# Patient Record
Sex: Female | Born: 1960 | Race: White | Hispanic: No | Smoking: Never smoker
Health system: Southern US, Community
[De-identification: ages and names within clinical notes are randomized; demographics above are authoritative.]

## PROBLEM LIST (undated history)

## (undated) DIAGNOSIS — N200 Calculus of kidney: Secondary | ICD-10-CM

## (undated) DIAGNOSIS — E7800 Pure hypercholesterolemia, unspecified: Secondary | ICD-10-CM

## (undated) DIAGNOSIS — Z9889 Other specified postprocedural states: Secondary | ICD-10-CM

## (undated) DIAGNOSIS — Z87442 Personal history of urinary calculi: Secondary | ICD-10-CM

## (undated) DIAGNOSIS — R112 Nausea with vomiting, unspecified: Secondary | ICD-10-CM

## (undated) DIAGNOSIS — A159 Respiratory tuberculosis unspecified: Secondary | ICD-10-CM

## (undated) DIAGNOSIS — T8859XA Other complications of anesthesia, initial encounter: Secondary | ICD-10-CM

## (undated) DIAGNOSIS — R32 Unspecified urinary incontinence: Secondary | ICD-10-CM

## (undated) DIAGNOSIS — T4145XA Adverse effect of unspecified anesthetic, initial encounter: Secondary | ICD-10-CM

## (undated) HISTORY — PX: INTERSTIM IMPLANT PLACEMENT: SHX5130

## (undated) HISTORY — PX: CHOLECYSTECTOMY: SHX55

## (undated) HISTORY — PX: TUBAL LIGATION: SHX77

## (undated) HISTORY — PX: HIP SURGERY: SHX245

## (undated) HISTORY — PX: APPENDECTOMY: SHX54

---

## 2013-02-01 DIAGNOSIS — R159 Full incontinence of feces: Secondary | ICD-10-CM | POA: Insufficient documentation

## 2013-02-01 DIAGNOSIS — Z227 Latent tuberculosis: Secondary | ICD-10-CM | POA: Insufficient documentation

## 2013-02-12 HISTORY — PX: ANAL SPHINCTEROPLASTY: SUR1305

## 2015-02-05 DIAGNOSIS — R159 Full incontinence of feces: Secondary | ICD-10-CM | POA: Insufficient documentation

## 2015-04-14 ENCOUNTER — Inpatient Hospital Stay (HOSPITAL_COMMUNITY)
Admission: EM | Admit: 2015-04-14 | Discharge: 2015-04-16 | DRG: 694 | Disposition: A | Discharge status: Home / Self Care | Payer: Non-veteran care | Attending: Internal Medicine | Admitting: Internal Medicine

## 2015-04-14 ENCOUNTER — Encounter (HOSPITAL_COMMUNITY): Payer: Self-pay | Admitting: Emergency Medicine

## 2015-04-14 ENCOUNTER — Emergency Department (HOSPITAL_COMMUNITY): Payer: Non-veteran care

## 2015-04-14 DIAGNOSIS — N23 Unspecified renal colic: Secondary | ICD-10-CM

## 2015-04-14 DIAGNOSIS — N201 Calculus of ureter: Secondary | ICD-10-CM

## 2015-04-14 DIAGNOSIS — R1032 Left lower quadrant pain: Secondary | ICD-10-CM | POA: Diagnosis present

## 2015-04-14 DIAGNOSIS — D72829 Elevated white blood cell count, unspecified: Secondary | ICD-10-CM | POA: Diagnosis present

## 2015-04-14 DIAGNOSIS — N1330 Unspecified hydronephrosis: Secondary | ICD-10-CM

## 2015-04-14 DIAGNOSIS — N139 Obstructive and reflux uropathy, unspecified: Secondary | ICD-10-CM | POA: Diagnosis present

## 2015-04-14 DIAGNOSIS — R110 Nausea: Secondary | ICD-10-CM

## 2015-04-14 DIAGNOSIS — E872 Acidosis: Secondary | ICD-10-CM | POA: Diagnosis present

## 2015-04-14 DIAGNOSIS — N390 Urinary tract infection, site not specified: Secondary | ICD-10-CM | POA: Diagnosis present

## 2015-04-14 DIAGNOSIS — N132 Hydronephrosis with renal and ureteral calculous obstruction: Principal | ICD-10-CM | POA: Diagnosis present

## 2015-04-14 DIAGNOSIS — D696 Thrombocytopenia, unspecified: Secondary | ICD-10-CM | POA: Diagnosis present

## 2015-04-14 DIAGNOSIS — N3080 Other cystitis without hematuria: Secondary | ICD-10-CM

## 2015-04-14 DIAGNOSIS — R1012 Left upper quadrant pain: Secondary | ICD-10-CM

## 2015-04-14 DIAGNOSIS — R109 Unspecified abdominal pain: Secondary | ICD-10-CM

## 2015-04-14 DIAGNOSIS — E875 Hyperkalemia: Secondary | ICD-10-CM | POA: Diagnosis present

## 2015-04-14 DIAGNOSIS — N179 Acute kidney failure, unspecified: Secondary | ICD-10-CM | POA: Diagnosis present

## 2015-04-14 LAB — I-STAT CHEM 8, ED
BUN: 22 mg/dL — AB (ref 6–20)
BUN: 31 mg/dL — ABNORMAL HIGH (ref 6–20)
CALCIUM ION: 1.23 mmol/L (ref 1.12–1.23)
CHLORIDE: 104 mmol/L (ref 101–111)
CREATININE: 1.1 mg/dL — AB (ref 0.44–1.00)
Calcium, Ion: 1.23 mmol/L (ref 1.12–1.23)
Chloride: 104 mmol/L (ref 101–111)
Creatinine, Ser: 1 mg/dL (ref 0.44–1.00)
GLUCOSE: 150 mg/dL — AB (ref 65–99)
Glucose, Bld: 157 mg/dL — ABNORMAL HIGH (ref 65–99)
HCT: 43 % (ref 36.0–46.0)
HEMATOCRIT: 43 % (ref 36.0–46.0)
HEMOGLOBIN: 14.6 g/dL (ref 12.0–15.0)
Hemoglobin: 14.6 g/dL (ref 12.0–15.0)
POTASSIUM: 5.4 mmol/L — AB (ref 3.5–5.1)
Potassium: 3.8 mmol/L (ref 3.5–5.1)
SODIUM: 140 mmol/L (ref 135–145)
Sodium: 142 mmol/L (ref 135–145)
TCO2: 25 mmol/L (ref 0–100)
TCO2: 29 mmol/L (ref 0–100)

## 2015-04-14 LAB — CBC WITH DIFFERENTIAL/PLATELET
Basophils Absolute: 0 10*3/uL (ref 0.0–0.1)
Basophils Relative: 0 %
EOS ABS: 0 10*3/uL (ref 0.0–0.7)
Eosinophils Relative: 0 %
HCT: 40.6 % (ref 36.0–46.0)
HEMOGLOBIN: 13.7 g/dL (ref 12.0–15.0)
LYMPHS ABS: 0.7 10*3/uL (ref 0.7–4.0)
LYMPHS PCT: 6 %
MCH: 29.4 pg (ref 26.0–34.0)
MCHC: 33.7 g/dL (ref 30.0–36.0)
MCV: 87.1 fL (ref 78.0–100.0)
Monocytes Absolute: 0.8 10*3/uL (ref 0.1–1.0)
Monocytes Relative: 7 %
NEUTROS PCT: 87 %
Neutro Abs: 10.8 10*3/uL — ABNORMAL HIGH (ref 1.7–7.7)
Platelets: 144 10*3/uL — ABNORMAL LOW (ref 150–400)
RBC: 4.66 MIL/uL (ref 3.87–5.11)
RDW: 12.6 % (ref 11.5–15.5)
WBC: 12.3 10*3/uL — AB (ref 4.0–10.5)

## 2015-04-14 LAB — COMPREHENSIVE METABOLIC PANEL
ALT: 15 U/L (ref 14–54)
AST: 24 U/L (ref 15–41)
Albumin: 3.9 g/dL (ref 3.5–5.0)
Alkaline Phosphatase: 112 U/L (ref 38–126)
Anion gap: 8 (ref 5–15)
BUN: 22 mg/dL — AB (ref 6–20)
CHLORIDE: 106 mmol/L (ref 101–111)
CO2: 26 mmol/L (ref 22–32)
CREATININE: 1.23 mg/dL — AB (ref 0.44–1.00)
Calcium: 9.3 mg/dL (ref 8.9–10.3)
GFR calc Af Amer: 57 mL/min — ABNORMAL LOW (ref >60)
GFR calc non Af Amer: 50 mL/min — ABNORMAL LOW (ref >60)
Glucose, Bld: 150 mg/dL — ABNORMAL HIGH (ref 65–99)
Potassium: 4 mmol/L (ref 3.5–5.1)
SODIUM: 140 mmol/L (ref 135–145)
Total Bilirubin: 0.5 mg/dL (ref 0.3–1.2)
Total Protein: 7 g/dL (ref 6.5–8.1)

## 2015-04-14 LAB — URINE MICROSCOPIC-ADD ON

## 2015-04-14 LAB — URINALYSIS, ROUTINE W REFLEX MICROSCOPIC
Bilirubin Urine: NEGATIVE
GLUCOSE, UA: NEGATIVE mg/dL
Ketones, ur: NEGATIVE mg/dL
Leukocytes, UA: NEGATIVE
Nitrite: POSITIVE — AB
PH: 8 (ref 5.0–8.0)
Protein, ur: NEGATIVE mg/dL
Specific Gravity, Urine: 1.015 (ref 1.005–1.030)

## 2015-04-14 LAB — LACTIC ACID, PLASMA: Lactic Acid, Venous: 1.4 mmol/L (ref 0.5–2.0)

## 2015-04-14 LAB — I-STAT CG4 LACTIC ACID, ED: LACTIC ACID, VENOUS: 2.05 mmol/L — AB (ref 0.5–2.0)

## 2015-04-14 MED ORDER — ACETAMINOPHEN 325 MG PO TABS: 650.0000 mg | ORAL_TABLET | Freq: Four times a day (QID) | ORAL | Status: DC | PRN

## 2015-04-14 MED ORDER — DEXTROSE 5 % IV SOLN
1.0000 g | Freq: Once | INTRAVENOUS | Status: AC
  Administered 2015-04-14: 1 g via INTRAVENOUS
  Filled 2015-04-14: qty 10

## 2015-04-14 MED ORDER — TAMSULOSIN HCL 0.4 MG PO CAPS
0.4000 mg | ORAL_CAPSULE | Freq: Every day | ORAL | Status: DC
  Administered 2015-04-16: 0.4 mg via ORAL
  Filled 2015-04-14: qty 1

## 2015-04-14 MED ORDER — ENOXAPARIN SODIUM 40 MG/0.4ML ~~LOC~~ SOLN
40.0000 mg | SUBCUTANEOUS | Status: DC
  Administered 2015-04-14: 40 mg via SUBCUTANEOUS
  Filled 2015-04-14: qty 0.4

## 2015-04-14 MED ORDER — ONDANSETRON HCL 4 MG/2ML IJ SOLN
4.0000 mg | INTRAMUSCULAR | Status: AC | PRN
  Administered 2015-04-14 (×2): 4 mg via INTRAVENOUS
  Filled 2015-04-14 (×2): qty 2

## 2015-04-14 MED ORDER — DEXTROSE 5 % IV SOLN
1.0000 g | INTRAVENOUS | Status: DC
  Filled 2015-04-14 (×3): qty 10

## 2015-04-14 MED ORDER — ACETAMINOPHEN 650 MG RE SUPP: 650.0000 mg | Freq: Four times a day (QID) | RECTAL | Status: DC | PRN

## 2015-04-14 MED ORDER — SENNOSIDES-DOCUSATE SODIUM 8.6-50 MG PO TABS
1.0000 | ORAL_TABLET | Freq: Every day | ORAL | Status: DC
  Administered 2015-04-14 – 2015-04-15 (×2): 1 via ORAL
  Filled 2015-04-14 (×2): qty 1

## 2015-04-14 MED ORDER — FENTANYL CITRATE (PF) 100 MCG/2ML IJ SOLN
50.0000 ug | Freq: Once | INTRAMUSCULAR | Status: AC
  Administered 2015-04-14: 50 ug via INTRAVENOUS
  Filled 2015-04-14: qty 2

## 2015-04-14 MED ORDER — ONDANSETRON HCL 4 MG/2ML IJ SOLN
4.0000 mg | Freq: Four times a day (QID) | INTRAMUSCULAR | Status: DC | PRN
  Administered 2015-04-14: 4 mg via INTRAVENOUS
  Filled 2015-04-14: qty 2

## 2015-04-14 MED ORDER — SODIUM CHLORIDE 0.9 % IV SOLN
INTRAVENOUS | Status: DC
  Administered 2015-04-14: 15:00:00 via INTRAVENOUS

## 2015-04-14 MED ORDER — ONDANSETRON HCL 4 MG PO TABS: 4.0000 mg | ORAL_TABLET | Freq: Four times a day (QID) | ORAL | Status: DC | PRN

## 2015-04-14 MED ORDER — MORPHINE SULFATE (PF) 4 MG/ML IV SOLN
4.0000 mg | INTRAVENOUS | Status: DC | PRN
  Administered 2015-04-14: 4 mg via INTRAVENOUS
  Filled 2015-04-14: qty 1

## 2015-04-14 MED ORDER — KETOROLAC TROMETHAMINE 30 MG/ML IJ SOLN
30.0000 mg | Freq: Four times a day (QID) | INTRAMUSCULAR | Status: DC
  Administered 2015-04-14: 30 mg via INTRAVENOUS
  Filled 2015-04-14: qty 1

## 2015-04-14 MED ORDER — KETOROLAC TROMETHAMINE 15 MG/ML IJ SOLN
15.0000 mg | Freq: Four times a day (QID) | INTRAMUSCULAR | Status: DC | PRN
  Administered 2015-04-14: 15 mg via INTRAVENOUS
  Filled 2015-04-14: qty 1

## 2015-04-14 MED ORDER — HYDROMORPHONE HCL 1 MG/ML IJ SOLN
0.5000 mg | INTRAMUSCULAR | Status: DC | PRN
  Administered 2015-04-14 – 2015-04-15 (×3): 0.5 mg via INTRAVENOUS
  Filled 2015-04-14 (×3): qty 1

## 2015-04-14 MED ORDER — SODIUM CHLORIDE 0.9 % IV SOLN: INTRAVENOUS | Status: DC

## 2015-04-14 NOTE — ED Provider Notes (Signed)
CSN: [id number]     Arrival date & time 04/14/15  1113 History   First MD Initiated Contact with Patient 04/14/15 1233     Chief Complaint  Patient presents with  . Flank Pain    left     HPI  Pt was seen at 1230. Per pt, c/o sudden onset and persistence of constant left sided flank "pain" that began this morning PTA.  Pt describes the pain as "radiating" into the left side of her abd.  Has been associated with nausea.   Denies vaginal bleeding/discharge, no dysuria/hematuria, no abd pain, no diarrhea, no black or blood in stools, no CP/SOB, no fevers, no rash.    History reviewed. No pertinent past medical history.   Past Surgical History  Procedure Laterality Date  . Hip surgery      right  . Appendectomy    . Cholecystectomy      Social History  Substance Use Topics  . Smoking status: Passive Smoke Exposure - Never Smoker  . Smokeless tobacco: None  . Alcohol Use: 0.6 oz/week    1 Glasses of wine per week    Review of Systems ROS: Statement: All systems negative except as marked or noted in the HPI; Constitutional: Negative for fever and chills. ; ; Eyes: Negative for eye pain, redness and discharge. ; ; ENMT: Negative for ear pain, hoarseness, nasal congestion, sinus pressure and sore throat. ; ; Cardiovascular: Negative for chest pain, palpitations, diaphoresis, dyspnea and peripheral edema. ; ; Respiratory: Negative for cough, wheezing and stridor. ; ; Gastrointestinal: +nausea. Negative for vomiting, diarrhea, abdominal pain, blood in stool, hematemesis, jaundice and rectal bleeding. . ; ; Genitourinary: +flank pain. Negative for dysuria and hematuria. ; ; Musculoskeletal: Negative for back pain and neck pain. Negative for swelling and trauma.; ; Skin: Negative for pruritus, rash, abrasions, blisters, bruising and skin lesion.; ; Neuro: Negative for headache, lightheadedness and neck stiffness. Negative for weakness, altered level of consciousness , altered mental status,  extremity weakness, paresthesias, involuntary movement, seizure and syncope.      Allergies  Stadol  Home Medications   Prior to Admission medications   Medication Sig Start Date End Date Taking? Authorizing Provider  SIMVASTATIN PO Take 1 tablet by mouth daily.   Yes Historical Provider, MD   BP 168/105 mmHg  Pulse 84  Temp(Src) 97.8 F (36.6 C) (Oral)  Resp 18  Ht  (1.676 m)  Wt 150 lb (68.04 kg)  BMI 24.22 kg/m2  SpO2 98% Physical Exam  1240: Physical examination:  Nursing notes reviewed; Vital signs and O2 SAT reviewed;  Constitutional: Well developed, Well nourished, Well hydrated, Uncomfortable appearing.; Head:  Normocephalic, atraumatic; Eyes: EOMI, PERRL, No scleral icterus; ENMT: Mouth and pharynx normal, Mucous membranes moist; Neck: Supple, Full range of motion, No lymphadenopathy; Cardiovascular: Regular rate and rhythm, No murmur, rub, or gallop; Respiratory: Breath sounds clear & equal bilaterally, No rales, rhonchi, wheezes.  Speaking full sentences with ease, Normal respiratory effort/excursion; Chest: Nontender, Movement normal; Abdomen: Soft, Nontender, Nondistended, Normal bowel sounds; Genitourinary: +left CVA tenderness; Spine:  No midline CS, TS, LS tenderness. +TTP left lumbar paraspinal muscles.;; Extremities: Pulses normal, Pelvis stable. NT left hip. No deformity. No edema, No calf edema or asymmetry.; Neuro: AA&Ox3, Major CN grossly intact.  Speech clear. No gross focal motor or sensory deficits in extremities.; Skin: Color normal, Warm, Dry.   ED Course  Procedures (including critical care time) Labs Review  Imaging Review  I have personally reviewed  and evaluated these images and lab results as part of my medical decision-making.   EKG Interpretation None      MDM  MDM Reviewed: nursing note and vitals Interpretation: labs and CT scan      Results for orders placed or performed during the hospital encounter of 04/14/15  Urinalysis,  Routine w reflex microscopic  Result Value Ref Range   Color, Urine YELLOW YELLOW   APPearance HAZY (A) CLEAR   Specific Gravity, Urine 1.015 1.005 - 1.030   pH 8.0 5.0 - 8.0   Glucose, UA NEGATIVE NEGATIVE mg/dL   Hgb urine dipstick TRACE (A) NEGATIVE   Bilirubin Urine NEGATIVE NEGATIVE   Ketones, ur NEGATIVE NEGATIVE mg/dL   Protein, ur NEGATIVE NEGATIVE mg/dL   Nitrite POSITIVE (A) NEGATIVE   Leukocytes, UA NEGATIVE NEGATIVE  Urine microscopic-add on  Result Value Ref Range   Squamous Epithelial / LPF 0-5 (A) NONE SEEN   WBC, UA 0-5 0 - 5 WBC/hpf   RBC / HPF 0-5 0 - 5 RBC/hpf   Bacteria, UA MANY (A) NONE SEEN  CBC with Differential  Result Value Ref Range   WBC 12.3 (H) 4.0 - 10.5 K/uL   RBC 4.66 3.87 - 5.11 MIL/uL   Hemoglobin 13.7 12.0 - 15.0 g/dL   HCT 52.8 41.3 - 24.4 %   MCV 87.1 78.0 - 100.0 fL   MCH 29.4 26.0 - 34.0 pg   MCHC 33.7 30.0 - 36.0 g/dL   RDW 01.0 27.2 - 53.6 %   Platelets 144 (L) 150 - 400 K/uL   Neutrophils Relative % 87 %   Neutro Abs 10.8 (H) 1.7 - 7.7 K/uL   Lymphocytes Relative 6 %   Lymphs Abs 0.7 0.7 - 4.0 K/uL   Monocytes Relative 7 %   Monocytes Absolute 0.8 0.1 - 1.0 K/uL   Eosinophils Relative 0 %   Eosinophils Absolute 0.0 0.0 - 0.7 K/uL   Basophils Relative 0 %   Basophils Absolute 0.0 0.0 - 0.1 K/uL  I-stat Chem 8, ED  Result Value Ref Range   Sodium 142 135 - 145 mmol/L   Potassium 3.8 3.5 - 5.1 mmol/L   Chloride 104 101 - 111 mmol/L   BUN 22 (H) 6 - 20 mg/dL   Creatinine, Ser 6.44 0.44 - 1.00 mg/dL   Glucose, Bld 034 (H) 65 - 99 mg/dL   Calcium, Ion 7.42 5.95 - 1.23 mmol/L   TCO2 25 0 - 100 mmol/L   Hemoglobin 14.6 12.0 - 15.0 g/dL   HCT 63.8 75.6 - 43.3 %   Ct Renal Stone Study 04/14/2015  CLINICAL DATA:  LEFT FLANK PAIN THAT STARTED THIS AM ABOUT 9AM, DENIES INJURY EXAM: CT ABDOMEN AND PELVIS WITHOUT CONTRAST TECHNIQUE: Multidetector CT imaging of the abdomen and pelvis was performed following the standard protocol without  IV contrast. COMPARISON:  None. FINDINGS: Lower chest:  Normal Hepatobiliary: The status post cholecystectomy Pancreas: Normal Spleen: Normal Adrenals/Urinary Tract: Adrenal glands are normal. Three calculi right kidney mid to lower pole measuring 1-2 mm each. On the left there is severe dilatation of the left renal pelvis due to a UPJ stone. Renal pelvis measures 16 mm with hydronephrosis and perinephric inflammation. Ureteral pelvic junction stone measures 12 mm. Tiny focus of air in the bladder suggests instrumentation but can also represent emphysematous cystitis. Correlate clinically. Ileocolic ligament suspected as well. Stomach/Bowel: Small hiatal hernia. Nonobstructive gas pattern. No significant abnormalities involving small or large bowel. Numerous calcifications in the otherwise  normal appearing appendix. Vascular/Lymphatic: No acute findings Reproductive: No significant abnormalities Other: No ascites Musculoskeletal: No acute abnormalities IMPRESSION: Severe hydronephrosis on the left due to ureteral pelvic junction stone. Electronically Signed   By: Esperanza Heir M.D.   On: 04/14/2015 14:15    1450:  Pt continues to c/o pain despite multiple doses of IV pain meds. Pt gave clean catch UA, concerned regarding CT findings of air in bladder.  T/C to Urology Dr. Isabel Caprice, case discussed, including:  HPI, pertinent PM/SHx, VS/PE, dx testing, ED course and treatment:  Agreeable to consult, pt will likely need stent today or tomorrow, requests to start IV rocephin, admit to hospitalist service.  T/C to [facility] Dr. Allena Katz, case discussed, including:  HPI, pertinent PM/SHx, VS/PE, dx testing, ED course and treatment:  Agreeable to admit, requests to write temporary orders, obtain medical bed to team APAdmits.    Samuel Jester, DO 04/16/15 1309

## 2015-04-14 NOTE — Progress Notes (Signed)
ANTIBIOTIC CONSULT NOTE-Preliminary  Pharmacy Consult for Rocephin Indication: UTI  Allergies  Allergen Reactions  . Stadol [Butorphanol] Other (See Comments)    "goes under"   Patient Measurements: Height:  (167.6 cm) Weight: 150 lb (68.04 kg) IBW/kg (Calculated) : 59.3  Vital Signs: Temp: 97.8 F (36.6 C) (01/30 1120) Temp Source: Oral (01/30 1120) BP: 169/100 mmHg (01/30 1530) Pulse Rate: 84 (01/30 1530)  Labs:  Recent Labs  04/14/15 1250 04/14/15 1312 04/14/15 1511  WBC 12.3*  --   --   HGB 13.7 14.6 14.6  PLT 144*  --   --   CREATININE  --  1.00 1.10*    Estimated Creatinine Clearance: 56 mL/min (by C-G formula based on Cr of 1.1).  No results for input(s): VANCOTROUGH, VANCOPEAK, VANCORANDOM, GENTTROUGH, GENTPEAK, GENTRANDOM, TOBRATROUGH, TOBRAPEAK, TOBRARND, AMIKACINPEAK, AMIKACINTROU, AMIKACIN in the last 72 hours.   Microbiology: No results found for this or any previous visit (from the past 720 hour(s)).  Medical History: History reviewed. No pertinent past medical history. Anti-infectives    Start     Dose/Rate Route Frequency Ordered Stop   04/14/15 1515  cefTRIAXone (ROCEPHIN) 1 g in dextrose 5 % 50 mL IVPB     1 g 100 mL/hr over 30 Minutes Intravenous  Once 04/14/15 1507       Assessment: Asked to initiate Rocephin for UTI, 1st dose given in ED.  No abx allergies noted.  Estimated Creatinine Clearance: 56 mL/min (by C-G formula based on Cr of 1.1).  Goal of Therapy:  Eradicate infection.  Plan:  Preliminary review of pertinent patient information completed.  Protocol will be initiated with a one-time dose(s) of Rocephin 1gm (given in ED), no additional doses will be needed today.   Jeani Hawking clinical pharmacist will complete review during morning rounds to assess patient and finalize treatment regimen.  Lateisha, Thurlow A, [place] 04/14/2015,3:44 PM

## 2015-04-14 NOTE — ED Notes (Signed)
Having left severe hip pain, denies any injury.  Rates pain 10/10.  EMS BP 170/94.

## 2015-04-14 NOTE — ED Notes (Signed)
MD at bedside. 

## 2015-04-14 NOTE — ED Notes (Signed)
Hospitalist at bedside 

## 2015-04-14 NOTE — Consult Note (Signed)
Urology Consult  Referring physician: Lynden Oxford, MD Reason for referral: 12 mm left UPJ stone with severe hydronephrosis and  flank pain.  History of Present Illness: Christina Sawyer is a 55 year old female who presented today to the [facility] emergency room with approximately 24 hours of left flank pain which had progressively worsened. This was so associated with nausea. She apparently did have some chills. In the emergency room she was afebrile and did not appear to have overt evidence of sepsis. What blood cell count was minimally elevated at 12,000. Urinalysis was nitrite positive but negative for leukocytes. Microscopically there was no significant pyuria or microhematuria. Bacteria was noted. Stone protocol CT revealed evidence of dilation of the left renal pelvis. A 12 mm UPJ stone was noted. There was a tiny single bubble of air in the dome of the bladder. The patient was admitted for pain management and also to be certain that she was not developing early urosepsis.  She currently reports her pain as a 3. She has mild nausea. She has remained afebrile. She has no prior history of nephrolithiasis. She has had a procedure through a [place] uro-gynecologist which was a sphincteroplasty for rectal incontinence. She also has a InterStim device. She has been started on Rocephin. History reviewed. No pertinent past medical history. Past Surgical History  Procedure Laterality Date  . Hip surgery      right  . Appendectomy    . Cholecystectomy      Medications:  Scheduled: . [START ON 04/15/2015] cefTRIAXone (ROCEPHIN)  IV  1 g Intravenous Q24H  . enoxaparin (LOVENOX) injection  40 mg Subcutaneous Q24H  . ketorolac  30 mg Intravenous 4 times per day  . senna-docusate  1 tablet Oral QHS  . tamsulosin  0.4 mg Oral Daily    Allergies:  Allergies  Allergen Reactions  . Stadol [Butorphanol] Other (See Comments)    "goes under"    History reviewed. No pertinent family history.  Social History:   reports that she has been passively smoking.  She does not have any smokeless tobacco history on file. She reports that she drinks about 0.6 oz of alcohol per week. She reports that she does not use illicit drugs.  ROS positive for left-sided flank discomfort, positive for nausea, positive for chills. Patient denies dysuria or gross hematuria. Her review of systems is otherwise negative.  Physical Exam:  Vital signs in last 24 hours: Temp:  [97.8 F (36.6 C)-98.9 F (37.2 C)] 98.9 F (37.2 C) (01/30 1652) Pulse Rate:  [74-87] 87 (01/30 1652) Resp:  [16-18] 16 (01/30 1343) BP: (120-175)/(72-105) 164/91 mmHg (01/30 1652) SpO2:  [95 %-100 %] 98 % (01/30 1652) Weight:  [66.815 kg (147 lb 4.8 oz)-68.04 kg (150 lb)] 66.815 kg (147 lb 4.8 oz) (01/30 1652)  Constitutional: Vital signs reviewed. WD WN in NAD Head: Normocephalic and atraumatic   Eyes: PERRL, No scleral icterus.  Neck: Supple No  Gross JVD, mass.  Cardiovascular: RRR Pulmonary/Chest: Normal effort Abdominal: Soft. Non-tender, non-distended, bowel sounds are normal, no masses, organomegaly, or guarding present.  Genitourinary:Not examined Extremities: No cyanosis or edema  Neurological: Grossly non-focal.  Skin: Warm,very dry and intact. No rash, cyanosis   Laboratory Data:  Results for orders placed or performed during the hospital encounter of 04/14/15 (from the past 72 hour(s))  CBC with Differential     Status: Abnormal   Collection Time: 04/14/15 12:50 PM  Result Value Ref Range   WBC 12.3 (H) 4.0 - 10.5 K/uL  RBC 4.66 3.87 - 5.11 MIL/uL   Hemoglobin 13.7 12.0 - 15.0 g/dL   HCT 16.1 09.6 - 04.5 %   MCV 87.1 78.0 - 100.0 fL   MCH 29.4 26.0 - 34.0 pg   MCHC 33.7 30.0 - 36.0 g/dL   RDW 40.9 81.1 - 91.4 %   Platelets 144 (L) 150 - 400 K/uL   Neutrophils Relative % 87 %   Neutro Abs 10.8 (H) 1.7 - 7.7 K/uL   Lymphocytes Relative 6 %   Lymphs Abs 0.7 0.7 - 4.0 K/uL   Monocytes Relative 7 %   Monocytes Absolute 0.8  0.1 - 1.0 K/uL   Eosinophils Relative 0 %   Eosinophils Absolute 0.0 0.0 - 0.7 K/uL   Basophils Relative 0 %   Basophils Absolute 0.0 0.0 - 0.1 K/uL  I-stat Chem 8, ED     Status: Abnormal   Collection Time: 04/14/15  1:12 PM  Result Value Ref Range   Sodium 142 135 - 145 mmol/L   Potassium 3.8 3.5 - 5.1 mmol/L   Chloride 104 101 - 111 mmol/L   BUN 22 (H) 6 - 20 mg/dL   Creatinine, Ser 7.82 0.44 - 1.00 mg/dL   Glucose, Bld 956 (H) 65 - 99 mg/dL   Calcium, Ion 2.13 0.86 - 1.23 mmol/L   TCO2 25 0 - 100 mmol/L   Hemoglobin 14.6 12.0 - 15.0 g/dL   HCT 57.8 46.9 - 62.9 %  Urinalysis, Routine w reflex microscopic     Status: Abnormal   Collection Time: 04/14/15  1:32 PM  Result Value Ref Range   Color, Urine YELLOW YELLOW   APPearance HAZY (A) CLEAR   Specific Gravity, Urine 1.015 1.005 - 1.030   pH 8.0 5.0 - 8.0   Glucose, UA NEGATIVE NEGATIVE mg/dL   Hgb urine dipstick TRACE (A) NEGATIVE   Bilirubin Urine NEGATIVE NEGATIVE   Ketones, ur NEGATIVE NEGATIVE mg/dL   Protein, ur NEGATIVE NEGATIVE mg/dL   Nitrite POSITIVE (A) NEGATIVE   Leukocytes, UA NEGATIVE NEGATIVE  Urine microscopic-add on     Status: Abnormal   Collection Time: 04/14/15  1:32 PM  Result Value Ref Range   Squamous Epithelial / LPF 0-5 (A) NONE SEEN   WBC, UA 0-5 0 - 5 WBC/hpf   RBC / HPF 0-5 0 - 5 RBC/hpf   Bacteria, UA MANY (A) NONE SEEN  I-stat chem 8, ed     Status: Abnormal   Collection Time: 04/14/15  3:11 PM  Result Value Ref Range   Sodium 140 135 - 145 mmol/L   Potassium 5.4 (H) 3.5 - 5.1 mmol/L   Chloride 104 101 - 111 mmol/L   BUN 31 (H) 6 - 20 mg/dL   Creatinine, Ser 5.28 (H) 0.44 - 1.00 mg/dL   Glucose, Bld 413 (H) 65 - 99 mg/dL   Calcium, Ion 2.44 0.10 - 1.23 mmol/L   TCO2 29 0 - 100 mmol/L   Hemoglobin 14.6 12.0 - 15.0 g/dL   HCT 27.2 53.6 - 64.4 %  I-Stat CG4 Lactic Acid, ED     Status: Abnormal   Collection Time: 04/14/15  3:20 PM  Result Value Ref Range   Lactic Acid, Venous 2.05  (HH) 0.5 - 2.0 mmol/L   Comment NOTIFIED PHYSICIAN    No results found for this or any previous visit (from the past 240 hour(s)). Creatinine:  Recent Labs  04/14/15 1312 04/14/15 1511  CREATININE 1.00 1.10*   Baseline Creatinine:  Impression/Assessment:  12 mm left UPJ stone. No definitive evidence of urosepsis at this time. Urinalysis shows some bacteria but little in the way of pyuria and microscopic urinalysis otherwise relatively unremarkable. Minimally elevated white blood cell count is not uncommon with acute renal obstruction. Stone is very unlikely to pass spontaneously. Her pain at this time is certainly well managed with only mild nausea. There is no indication for urgent stent placement. The patient will be best managed with double-J stent placement tomorrow followed by subsequent elective ESWL or potentially ureteroscopy with holmium laser lithotripsy. Definitive surgery will be planned for 1-2 weeks status post her stent placement or potentially sooner if it is ESWL. She does receive her care through the [place] system in [place]. It is possible she will need to go back there for consideration of her definitive urologic procedure. Nothing by mouth after midnight. I discussed the pros and cons of this approach with her. She is in agreement to proceeding with stent placement to unobstruct the kidney followed by definitive stone management. Of note Hounsfield units on the stone are approximately 1100. Plan:  As above  Christina Sawyer S 04/14/2015, 7:31 PM

## 2015-04-14 NOTE — H&P (Signed)
[facility] History and Physical   Patient: Christina Sawyer KGM:[id number]   PCP: Pcp Not In System DOB: August 18, 1960   DOA: 04/14/2015   DOS: 04/14/2015   DOS: the patient was seen and examined on 04/14/2015  Referring physician: Dr Verne Spurr Chief Complaint: left flank pain  HPI: Christina Sawyer is a 55 y.o. female with Past medical history of rectal incontinence status post stenting. Patient presents with complaints of 1 day onset of left flank pain which progressively worsened throughout the day. This was associated with nausea. She started having fever and chills. In the hospital. The pain is radiating from her left flank to her groin and is currently in the suprapubic area. She denies any bleeding in the urine. No diarrhea no constipation. She has undergone some stenting procedure with gynecology urologist in the past. [facility] She does not take any medication does not have any major surgeries other than mentioned above. She denies any prior history of stone prior history of recurrent UTI. She denies taking any medication or herbal supplements.  The patient is coming from home.  At her baseline ambulates without support And is independent for most of her ADL; manages her medication on her own.  Review of Systems: as mentioned in the history of present illness.  A comprehensive review of the other systems is negative.  History reviewed. No pertinent past medical history. Past Surgical History  Procedure Laterality Date  . Hip surgery      right  . Appendectomy    . Cholecystectomy     Social History:  reports that she has been passively smoking.  She does not have any smokeless tobacco history on file. She reports that she drinks about 0.6 oz of alcohol per week. She reports that she does not use illicit drugs.  Allergies  Allergen Reactions  . Stadol [Butorphanol] Other (See Comments)    "goes under"    History reviewed. No pertinent family history.  Prior to  Admission medications   Medication Sig Start Date End Date Taking? Authorizing Provider  SIMVASTATIN PO Take 1 tablet by mouth daily.   Yes Historical Provider, MD    Physical Exam: Filed Vitals:   04/14/15 1300 04/14/15 1343 04/14/15 1502 04/14/15 1530  BP: 165/94 165/94 165/97 169/100  Pulse: 77 77 83 84  Temp:      TempSrc:      Resp:  16    Height:      Weight:      SpO2: 95% 95% 97% 95%    General: Alert, Awake and Oriented to Time, Place and Person. Appear in marked distress Eyes: PERRL ENT: Oral Mucosa clear moist. Neck: no JVD Cardiovascular: S1 and S2 Present, no Murmur, Peripheral Pulses Present Respiratory: Bilateral Air entry equal and Decreased,  Clear to Auscultation, no Crackles, no wheezes Abdomen: Bowel Sound present, Soft and left CVA tenderness Skin: no Rash Extremities: no Pedal edema, no calf tenderness Neurologic: Grossly no focal neuro deficit.  Labs on Admission:  CBC:  Recent Labs Lab 04/14/15 1250 04/14/15 1312 04/14/15 1511  WBC 12.3*  --   --   NEUTROABS 10.8*  --   --   HGB 13.7 14.6 14.6  HCT 40.6 43.0 43.0  MCV 87.1  --   --   PLT 144*  --   --     CMP     Component Value Date/Time   NA 140 04/14/2015 1511   K 5.4* 04/14/2015 1511   CL 104  04/14/2015 1511   GLUCOSE 157* 04/14/2015 1511   BUN 31* 04/14/2015 1511   CREATININE 1.10* 04/14/2015 1511    No results for input(s): CKTOTAL, CKMB, CKMBINDEX, TROPONINI in the last 168 hours. BNP (last 3 results) No results for input(s): BNP in the last 8760 hours.  ProBNP (last 3 results) No results for input(s): PROBNP in the last 8760 hours.   Radiological Exams on Admission: Ct Renal Stone Study  04/14/2015  CLINICAL DATA:  LEFT FLANK PAIN THAT STARTED THIS AM ABOUT 9AM, DENIES INJURY EXAM: CT ABDOMEN AND PELVIS WITHOUT CONTRAST TECHNIQUE: Multidetector CT imaging of the abdomen and pelvis was performed following the standard protocol without IV contrast. COMPARISON:  None.  FINDINGS: Lower chest:  Normal Hepatobiliary: The status post cholecystectomy Pancreas: Normal Spleen: Normal Adrenals/Urinary Tract: Adrenal glands are normal. Three calculi right kidney mid to lower pole measuring 1-2 mm each. On the left there is severe dilatation of the left renal pelvis due to a UPJ stone. Renal pelvis measures 16 mm with hydronephrosis and perinephric inflammation. Ureteral pelvic junction stone measures 12 mm. Tiny focus of air in the bladder suggests instrumentation but can also represent emphysematous cystitis. Correlate clinically. Ileocolic ligament suspected as well. Stomach/Bowel: Small hiatal hernia. Nonobstructive gas pattern. No significant abnormalities involving small or large bowel. Numerous calcifications in the otherwise normal appearing appendix. Vascular/Lymphatic: No acute findings Reproductive: No significant abnormalities Other: No ascites Musculoskeletal: No acute abnormalities IMPRESSION: Severe hydronephrosis on the left due to ureteral pelvic junction stone. Electronically Signed   By: Esperanza Heir M.D.   On: 04/14/2015 14:15   Assessment/Plan 1. Acute unilateral obstructive uropathy    Hydronephrosis of left kidney   Lactic acidosis   Leucocytosis   UTI (urinary tract infection)   Nausea Patient is presenting with complaints of severe left-sided flank pain. UA is negative for any increased WBC or hematuria but positive for nitrites. CT renal stone study is positive for severe hydronephrosis on the left due to UPJ stone 12 mm in size. 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 Urology Dr. Isabel Caprice has been consulted by ER who will be following up with the patient. Keeping the patient nothing by mouth at present. Using IV ceftriaxone for a suspected UTI. IV hydration, IV Dilaudid as needed, IV Toradol scheduled. IV Zofran as needed for nausea. Mild lactic acidosis is also present. We will recheck for resolution after hydration.  2. Hyperkalemia Improved with  IV hydration. We'll continue to monitor.  Nutrition: NPO pending urology consultation DVT Prophylaxis: subcutaneous Heparin  Advance goals of care discussion: full code   Consults: urology Dr Isabel Caprice  Family Communication: nofamily was present at bedside, opportunity was given to ask question and all questions were answered satisfactorily at the time of interview. Disposition: Admitted as inpatient, medsurge unit.  Author: Lynden Oxford, MD [facility] Pager: [phone] 04/14/2015  If 7PM-7AM, please contact night-coverage [email or web address] Password [email or web address]

## 2015-04-15 ENCOUNTER — Encounter (HOSPITAL_COMMUNITY): Payer: Self-pay | Admitting: *Deleted

## 2015-04-15 ENCOUNTER — Encounter (HOSPITAL_COMMUNITY)
Admission: EM | Disposition: A | Discharge status: Home / Self Care | Payer: Self-pay | Source: Home / Self Care | Attending: Internal Medicine

## 2015-04-15 ENCOUNTER — Inpatient Hospital Stay (HOSPITAL_COMMUNITY): Payer: Non-veteran care

## 2015-04-15 ENCOUNTER — Inpatient Hospital Stay (HOSPITAL_COMMUNITY): Payer: Non-veteran care | Admitting: Anesthesiology

## 2015-04-15 DIAGNOSIS — N132 Hydronephrosis with renal and ureteral calculous obstruction: Secondary | ICD-10-CM

## 2015-04-15 HISTORY — PX: CYSTOSCOPY W/ URETERAL STENT PLACEMENT: SHX1429

## 2015-04-15 LAB — COMPREHENSIVE METABOLIC PANEL
ALT: 14 U/L (ref 14–54)
ANION GAP: 9 (ref 5–15)
AST: 26 U/L (ref 15–41)
Albumin: 3.4 g/dL — ABNORMAL LOW (ref 3.5–5.0)
Alkaline Phosphatase: 89 U/L (ref 38–126)
BUN: 31 mg/dL — ABNORMAL HIGH (ref 6–20)
CALCIUM: 9 mg/dL (ref 8.9–10.3)
CHLORIDE: 106 mmol/L (ref 101–111)
CO2: 27 mmol/L (ref 22–32)
Creatinine, Ser: 1.79 mg/dL — ABNORMAL HIGH (ref 0.44–1.00)
GFR calc non Af Amer: 31 mL/min — ABNORMAL LOW (ref >60)
GFR, EST AFRICAN AMERICAN: 36 mL/min — AB (ref >60)
Glucose, Bld: 130 mg/dL — ABNORMAL HIGH (ref 65–99)
Potassium: 4.2 mmol/L (ref 3.5–5.1)
SODIUM: 142 mmol/L (ref 135–145)
Total Bilirubin: 1 mg/dL (ref 0.3–1.2)
Total Protein: 6.3 g/dL — ABNORMAL LOW (ref 6.5–8.1)

## 2015-04-15 LAB — CBC
HCT: 37.6 % (ref 36.0–46.0)
Hemoglobin: 12.7 g/dL (ref 12.0–15.0)
MCH: 29.8 pg (ref 26.0–34.0)
MCHC: 33.8 g/dL (ref 30.0–36.0)
MCV: 88.3 fL (ref 78.0–100.0)
PLATELETS: 105 10*3/uL — AB (ref 150–400)
RBC: 4.26 MIL/uL (ref 3.87–5.11)
RDW: 12.9 % (ref 11.5–15.5)
WBC: 14.9 10*3/uL — AB (ref 4.0–10.5)

## 2015-04-15 LAB — PROTIME-INR
INR: 1.23 (ref 0.00–1.49)
Prothrombin Time: 15.7 seconds — ABNORMAL HIGH (ref 11.6–15.2)

## 2015-04-15 LAB — SURGICAL PCR SCREEN
MRSA, PCR: NEGATIVE
STAPHYLOCOCCUS AUREUS: NEGATIVE

## 2015-04-15 SURGERY — CYSTOSCOPY, WITH RETROGRADE PYELOGRAM AND URETERAL STENT INSERTION
Anesthesia: General | Site: Ureter | Laterality: Left

## 2015-04-15 MED ORDER — SODIUM CHLORIDE 0.9 % IR SOLN
Status: DC | PRN
  Administered 2015-04-15: 3000 mL

## 2015-04-15 MED ORDER — DEXAMETHASONE SODIUM PHOSPHATE 4 MG/ML IJ SOLN
INTRAMUSCULAR | Status: AC
  Filled 2015-04-15: qty 1

## 2015-04-15 MED ORDER — PROPOFOL 10 MG/ML IV BOLUS
INTRAVENOUS | Status: DC | PRN
  Administered 2015-04-15: 100 mg via INTRAVENOUS

## 2015-04-15 MED ORDER — ONDANSETRON HCL 4 MG/2ML IJ SOLN: 4.0000 mg | Freq: Once | INTRAMUSCULAR | Status: DC | PRN

## 2015-04-15 MED ORDER — SODIUM CHLORIDE 0.45 % IV SOLN
INTRAVENOUS | Status: DC
  Administered 2015-04-15: 14:00:00 via INTRAVENOUS

## 2015-04-15 MED ORDER — ONDANSETRON HCL 4 MG/2ML IJ SOLN
4.0000 mg | Freq: Once | INTRAMUSCULAR | Status: AC
  Administered 2015-04-15: 4 mg via INTRAVENOUS

## 2015-04-15 MED ORDER — PROPOFOL 10 MG/ML IV BOLUS
INTRAVENOUS | Status: AC
  Filled 2015-04-15: qty 20

## 2015-04-15 MED ORDER — MIDAZOLAM HCL 2 MG/2ML IJ SOLN
INTRAMUSCULAR | Status: AC
  Filled 2015-04-15: qty 2

## 2015-04-15 MED ORDER — FENTANYL CITRATE (PF) 100 MCG/2ML IJ SOLN
50.0000 ug | Freq: Once | INTRAMUSCULAR | Status: AC
  Administered 2015-04-15: 50 ug via INTRAVENOUS

## 2015-04-15 MED ORDER — LACTATED RINGERS IV SOLN
INTRAVENOUS | Status: DC
  Administered 2015-04-15: 11:00:00 via INTRAVENOUS

## 2015-04-15 MED ORDER — DEXTROSE 5 % IV SOLN
INTRAVENOUS | Status: AC
  Filled 2015-04-15: qty 10

## 2015-04-15 MED ORDER — STERILE WATER FOR IRRIGATION IR SOLN
Status: DC | PRN
  Administered 2015-04-15: 1000 mL

## 2015-04-15 MED ORDER — METOCLOPRAMIDE HCL 10 MG PO TABS
5.0000 mg | ORAL_TABLET | Freq: Four times a day (QID) | ORAL | Status: DC | PRN
  Administered 2015-04-15: 5 mg via ORAL
  Filled 2015-04-15: qty 1

## 2015-04-15 MED ORDER — FENTANYL CITRATE (PF) 100 MCG/2ML IJ SOLN: 25.0000 ug | INTRAMUSCULAR | Status: DC | PRN

## 2015-04-15 MED ORDER — FENTANYL CITRATE (PF) 250 MCG/5ML IJ SOLN
INTRAMUSCULAR | Status: AC
  Filled 2015-04-15: qty 5

## 2015-04-15 MED ORDER — FENTANYL CITRATE (PF) 100 MCG/2ML IJ SOLN
25.0000 ug | INTRAMUSCULAR | Status: AC
  Administered 2015-04-15 (×2): 25 ug via INTRAVENOUS

## 2015-04-15 MED ORDER — DEXTROSE 5 % IV SOLN
1.0000 g | INTRAVENOUS | Status: DC | PRN
  Administered 2015-04-15: 1 g via INTRAVENOUS

## 2015-04-15 MED ORDER — ROCURONIUM BROMIDE 100 MG/10ML IV SOLN
INTRAVENOUS | Status: DC | PRN
  Administered 2015-04-15: 5 mg via INTRAVENOUS

## 2015-04-15 MED ORDER — MIDAZOLAM HCL 5 MG/5ML IJ SOLN
INTRAMUSCULAR | Status: DC | PRN
  Administered 2015-04-15: 1 mg via INTRAVENOUS

## 2015-04-15 MED ORDER — LIDOCAINE HCL 1 % IJ SOLN
INTRAMUSCULAR | Status: DC | PRN
  Administered 2015-04-15: 25 mg via INTRADERMAL

## 2015-04-15 MED ORDER — IOHEXOL 300 MG/ML  SOLN
INTRAMUSCULAR | Status: DC | PRN
  Administered 2015-04-15: 8 mL via URETHRAL

## 2015-04-15 MED ORDER — ONDANSETRON HCL 4 MG/2ML IJ SOLN
INTRAMUSCULAR | Status: AC
  Filled 2015-04-15: qty 2

## 2015-04-15 MED ORDER — FENTANYL CITRATE (PF) 100 MCG/2ML IJ SOLN
INTRAMUSCULAR | Status: AC
  Filled 2015-04-15: qty 2

## 2015-04-15 MED ORDER — DEXAMETHASONE SODIUM PHOSPHATE 4 MG/ML IJ SOLN
4.0000 mg | Freq: Once | INTRAMUSCULAR | Status: AC
  Administered 2015-04-15: 4 mg via INTRAVENOUS

## 2015-04-15 MED ORDER — FENTANYL CITRATE (PF) 100 MCG/2ML IJ SOLN
INTRAMUSCULAR | Status: DC | PRN
  Administered 2015-04-15 (×2): 25 ug via INTRAVENOUS

## 2015-04-15 MED ORDER — MIDAZOLAM HCL 2 MG/2ML IJ SOLN
1.0000 mg | INTRAMUSCULAR | Status: DC | PRN
  Administered 2015-04-15 (×2): 2 mg via INTRAVENOUS

## 2015-04-15 MED ORDER — SUCCINYLCHOLINE CHLORIDE 20 MG/ML IJ SOLN
INTRAMUSCULAR | Status: DC | PRN
  Administered 2015-04-15: 140 mg via INTRAVENOUS

## 2015-04-15 MED ORDER — ATORVASTATIN CALCIUM 40 MG PO TABS
40.0000 mg | ORAL_TABLET | Freq: Every day | ORAL | Status: DC
  Administered 2015-04-15: 40 mg via ORAL
  Filled 2015-04-15: qty 1

## 2015-04-15 SURGICAL SUPPLY — 22 items
BAG DRAIN URO TABLE W/ADPT NS (DRAPE) ×3 IMPLANT
BAG HAMPER (MISCELLANEOUS) ×3 IMPLANT
CATH INTERMIT  6FR 70CM (CATHETERS) ×3 IMPLANT
CLOTH BEACON ORANGE TIMEOUT ST (SAFETY) ×3 IMPLANT
DECANTER SPIKE VIAL GLASS SM (MISCELLANEOUS) ×3 IMPLANT
GLOVE BIO SURGEON STRL SZ7 (GLOVE) ×6 IMPLANT
GLOVE BIOGEL M 8.0 STRL (GLOVE) ×3 IMPLANT
GLOVE BIOGEL PI IND STRL 7.0 (GLOVE) ×4 IMPLANT
GLOVE BIOGEL PI INDICATOR 7.0 (GLOVE) ×2
GOWN STRL REIN XL XLG (GOWN DISPOSABLE) ×6 IMPLANT
GUIDEWIRE STR DUAL SENSOR (WIRE) ×3 IMPLANT
IV NS IRRIG 3000ML ARTHROMATIC (IV SOLUTION) ×3 IMPLANT
KIT ROOM TURNOVER AP CYSTO (KITS) ×3 IMPLANT
LASER FIBER DISP (UROLOGICAL SUPPLIES) IMPLANT
MANIFOLD NEPTUNE II (INSTRUMENTS) ×3 IMPLANT
PACK CYSTO (CUSTOM PROCEDURE TRAY) ×3 IMPLANT
PAD ARMBOARD 7.5X6 YLW CONV (MISCELLANEOUS) ×3 IMPLANT
STENT URET 6FRX24 CONTOUR (STENTS) ×3 IMPLANT
SYRINGE 10CC LL (SYRINGE) ×3 IMPLANT
TOWEL OR 17X26 4PK STRL BLUE (TOWEL DISPOSABLE) ×3 IMPLANT
WATER STERILE IRR 1000ML POUR (IV SOLUTION) ×3 IMPLANT
WATER STERILE IRR 3000ML UROMA (IV SOLUTION) ×3 IMPLANT

## 2015-04-15 NOTE — Transfer of Care (Signed)
Immediate Anesthesia Transfer of Care Note  Patient: Christina Sawyer  Procedure(s) Performed: Procedure(s): CYSTOSCOPY WITH RETROGRADE PYELOGRAM/URETERAL STENT PLACEMENT (Left)  Patient Location: PACU  Anesthesia Type:General  Level of Consciousness: sedated  Airway & Oxygen Therapy: Patient Spontanous Breathing and Patient connected to face mask oxygen  Post-op Assessment: Report given to RN, Post -op Vital signs reviewed and stable and Patient moving all extremities  Post vital signs: Reviewed and stable  Last Vitals:  Filed Vitals:   04/15/15 1125 04/15/15 1130  BP: 152/87 152/91  Pulse:    Temp:    Resp: 11 22    Complications: No apparent anesthesia complications

## 2015-04-15 NOTE — Care Management Note (Signed)
Case Management Note  Patient Details  Name: Christina Sawyer MRN: [id number] Date of Birth: October 11, 1960  Subjective/Objective:                  Pt is from home, lives alone. Pt is active with the [facility]. Pt is in [place] housing and recieves assistance from [place] social worker Alcario Drought) with housing and transportation. Pt is ind with ADL's. Pt plans to return home with self care. Pt is concerned about transportation home. [facility] made aware of [facility]'s concern, SW encouraging pt to find transportation through friends. [facility] [place] has been contacted and make aware of admission, TC did not request refusal to transfer form as pt is expected to DC in <24 hrs.   Action/Plan: CM will cont to follow.   Expected Discharge Date:  04/16/15               Expected Discharge Plan:  Home/Self Care  In-House Referral:  NA  Discharge planning Services  NA  Post Acute Care Choice:  NA Choice offered to:  NA  DME Arranged:    DME Agency:     HH Arranged:    HH Agency:     Status of Service:  In process, will continue to follow  Medicare Important Message Given:    Date Medicare IM Given:    Medicare IM give by:    Date Additional Medicare IM Given:    Additional Medicare Important Message give by:     If discussed at Long Length of Stay Meetings, dates discussed:    Additional Comments:  Malcolm Metro, RN 04/15/2015, 3:43 PM

## 2015-04-15 NOTE — Anesthesia Procedure Notes (Signed)
Procedure Name: Intubation Date/Time: 04/15/2015 11:51 AM Performed by: Despina Hidden Pre-anesthesia Checklist: Patient identified, Emergency Drugs available, Suction available and Patient being monitored Patient Re-evaluated:Patient Re-evaluated prior to inductionOxygen Delivery Method: Circle system utilized Preoxygenation: Pre-oxygenation with 100% oxygen Intubation Type: IV induction, Rapid sequence and Cricoid Pressure applied Ventilation: Mask ventilation without difficulty Laryngoscope Size: Mac and 3 Grade View: Grade I Tube type: Oral Tube size: 7.0 mm Number of attempts: 1 Airway Equipment and Method: Stylet and Oral airway Placement Confirmation: ETT inserted through vocal cords under direct vision,  positive ETCO2 and breath sounds checked- equal and bilateral Secured at: 22 cm Tube secured with: Tape Dental Injury: Teeth and Oropharynx as per pre-operative assessment

## 2015-04-15 NOTE — Anesthesia Preprocedure Evaluation (Addendum)
Anesthesia Evaluation  Patient identified by MRN, date of birth, ID band Patient awake    Reviewed: Allergy & Precautions, NPO status , Patient's Chart, lab work & pertinent test results  Airway Mallampati: I  TM Distance: >3 FB Neck ROM: Full    Dental  (+) Teeth Intact, Dental Advisory Given   Pulmonary neg pulmonary ROS,    breath sounds clear to auscultation       Cardiovascular negative cardio ROS   Rhythm:Regular Rate:Normal     Neuro/Psych    GI/Hepatic negative GI ROS, Nausea today   Endo/Other    Renal/GU Renal disease     Musculoskeletal   Abdominal   Peds  Hematology   Anesthesia Other Findings   Reproductive/Obstetrics                            Anesthesia Physical Anesthesia Plan  ASA: I  Anesthesia Plan: General   Post-op Pain Management:    Induction: Intravenous, Rapid sequence and Cricoid pressure planned  Airway Management Planned: Oral ETT  Additional Equipment:   Intra-op Plan:   Post-operative Plan: Extubation in OR  Informed Consent: I have reviewed the patients History and Physical, chart, labs and discussed the procedure including the risks, benefits and alternatives for the proposed anesthesia with the patient or authorized representative who has indicated his/her understanding and acceptance.     Plan Discussed with:   Anesthesia Plan Comments:         Anesthesia Quick Evaluation

## 2015-04-15 NOTE — Anesthesia Postprocedure Evaluation (Signed)
Anesthesia Post Note  Patient: Christina Sawyer  Procedure(s) Performed: Procedure(s) (LRB): CYSTOSCOPY WITH RETROGRADE PYELOGRAM/URETERAL STENT PLACEMENT (Left)  Patient location during evaluation: PACU Anesthesia Type: General Level of consciousness: awake and alert and patient cooperative Pain management: pain level controlled Vital Signs Assessment: post-procedure vital signs reviewed and stable Respiratory status: nonlabored ventilation, spontaneous breathing and patient connected to face mask oxygen Cardiovascular status: blood pressure returned to baseline and stable Postop Assessment: no signs of nausea or vomiting Anesthetic complications: no    Last Vitals:  Filed Vitals:   04/15/15 1130 04/15/15 1222  BP: 152/91 140/76  Pulse:    Temp:  38.5 C  Resp: 22 18    Last Pain:  Filed Vitals:   04/15/15 1229  PainSc: 0-No pain                 Geralynn Capri J

## 2015-04-15 NOTE — Progress Notes (Signed)
Pt examined--lethargic from fentanyl. Will proceed with cysto/left stent.

## 2015-04-15 NOTE — Op Note (Signed)
Preoperative diagnosis : Obstructing left UPJ stone with infection  Postoperative diagnosis: Same   Principal procedure: Cystoscopy, left retrograde ureteropyelogram with interpretive fluoroscopy, placement of double-J stent and left ureter using 6 x 24 contour stent without string   Surgeon: Norvel Wenker  Anesthesia: Gen.   Complications: None   Drains: 24 cm x 6 French contour double-J stent without string   Indications: 55 year old female admitted recently with left flank pain, evidence of urinary tract infection, and a 12 mm obstructing left UPJ stone. Urologic consultation was requested and provided last night. She presents at this time for stent placement for urgent drainage of her left renal unit.    Description of procedure:  The patient was identified and properly marked in the holding area. She had received fentanyl, so at that point was lethargic and relatively unresponsive.  She received preoperative IV Rocephin.  She was then taken to the operating room where general anesthetic was administered. She is placed in the dorsolithotomy position. Genitalia and perineum were prepped and draped. Proper timeout was performed.   a 31 French panendoscope was advanced into her bladder. Bladder was inspected circumferentially. There were no tumors, trabeculations or foreign bodies. Ureteral orifices were normal in configuration and location. The left ureteral orifice was cannulated with the 6 [place] open-ended catheter. Gentle retrograde ureteropyelogram was performed using Omnipaque.  The ureteropyelogram revealed a totally normal ureter throughout, with a filling defect at the UPJ. No stricture or filling defects were noted distal to the UPJ. There was moderate to severe hydronephrosis. No significant filling defects were seen other than at the UPJ. The filling defect was consistent with the previously mentioned stone.  At this point, a 0.038 inch sensor-tip guidewire was passed through the  open-ended catheter, and easily guided past the stone into the upper pole calyces. The guidewire was left in while the open-ended catheter was removed. Over top the guidewire, a 6 [place] by 24 cm contour double-J stent was placed , with the upper part being in the upper pole calyces, and the lower curl being in the bladder following removal of the guidewire. At this point, the bladder was drained and the scope removed. The patient was then awakened and taken to the PACU in stable condition.

## 2015-04-15 NOTE — Progress Notes (Signed)
[facility] PROGRESS NOTE  Ciclaly Mulcahey ZDG:[id number] DOB: 06-17-1960 DOA: 04/14/2015 PCP: Pcp Not In System   Chief Complaint: left flank pain HPI: Christina Sawyer is a 55 y.o. female with Past medical history of rectal incontinence status post s/p sphincteroplasty at [facility] Patient presented with complaints of 1 day onset of left flank pain which progressively worsened throughout the day. This was associated with nausea, fever and chills. CT with Severe hydronephrosis on the left due to ureteral pelvic junction stone.  Assessment/Plan: 1. Obstructive Uropathy -L UPJ calculus -possible UTI -continue IVF, ceftriaxone, FU urine CX -Urology Dr.Grapey following, for Double J stents today -supportive care  2. AKi -likely due to hydronephrosis from #1 -hydrate, monitor, bmet in am  3. H/o rectal incontinence s/p sphincteroplasty at [facility]  4. Thrombocytopenia -acute, stop lovenox, monitor  FUll Code Dispo: home tomorrow if stable  Consultants: Urology HPI/Subjective: Feels much better, no pain today, no N/V  Objective: Filed Vitals:   04/15/15 1125 04/15/15 1130  BP: 152/87 152/91  Pulse:    Temp:    Resp: 11 22    Intake/Output Summary (Last 24 hours) at 04/15/15 1145 Last data filed at 04/15/15 1135  Gross per 24 hour  Intake 1533.33 ml  Output    350 ml  Net 1183.33 ml   Filed Weights   04/14/15 1118 04/14/15 1652 04/15/15 0511  Weight: 68.04 kg (150 lb) 66.815 kg (147 lb 4.8 oz) 67.359 kg (148 lb 8 oz)    Exam:   General:  AAOx3  Cardiovascular: S1S2/RRR  Respiratory: CTAB  Abdomen: soft, NT, BS present  Musculoskeletal: no edema c/c  Data Reviewed: Basic Metabolic Panel:  Recent Labs Lab 04/14/15 1312 04/14/15 1511 04/14/15 1952 04/15/15 0548  NA 142 140 140 142  K 3.8 5.4* 4.0 4.2  CL 104 104 106 106  CO2  --   --  26 27  GLUCOSE 150* 157* 150* 130*  BUN 22* 31* 22* 31*  CREATININE 1.00 1.10* 1.23* 1.79*  CALCIUM  --   --  9.3 9.0   Liver  Function Tests:  Recent Labs Lab 04/14/15 1952 04/15/15 0548  AST 24 26  ALT 15 14  ALKPHOS 112 89  BILITOT 0.5 1.0  PROT 7.0 6.3*  ALBUMIN 3.9 3.4*   No results for input(s): LIPASE, AMYLASE in the last 168 hours. No results for input(s): AMMONIA in the last 168 hours. CBC:  Recent Labs Lab 04/14/15 1250 04/14/15 1312 04/14/15 1511 04/15/15 0548  WBC 12.3*  --   --  14.9*  NEUTROABS 10.8*  --   --   --   HGB 13.7 14.6 14.6 12.7  HCT 40.6 43.0 43.0 37.6  MCV 87.1  --   --  88.3  PLT 144*  --   --  105*   Cardiac Enzymes: No results for input(s): CKTOTAL, CKMB, CKMBINDEX, TROPONINI in the last 168 hours. BNP (last 3 results) No results for input(s): BNP in the last 8760 hours.  ProBNP (last 3 results) No results for input(s): PROBNP in the last 8760 hours.  CBG: No results for input(s): GLUCAP in the last 168 hours.  Recent Results (from the past 240 hour(s))  Urine culture     Status: None (Preliminary result)   Collection Time: 04/14/15  1:32 PM  Result Value Ref Range Status   Specimen Description URINE, CLEAN CATCH  Final   Special Requests NONE  Final   Culture   Final    >=100,000 COLONIES/mL GRAM  NEGATIVE RODS CULTURE REINCUBATED FOR BETTER GROWTH Performed at [facility]    Report Status PENDING  Incomplete     Studies: Ct Renal Stone Study  04/14/2015  CLINICAL DATA:  LEFT FLANK PAIN THAT STARTED THIS AM ABOUT 9AM, DENIES INJURY EXAM: CT ABDOMEN AND PELVIS WITHOUT CONTRAST TECHNIQUE: Multidetector CT imaging of the abdomen and pelvis was performed following the standard protocol without IV contrast. COMPARISON:  None. FINDINGS: Lower chest:  Normal Hepatobiliary: The status post cholecystectomy Pancreas: Normal Spleen: Normal Adrenals/Urinary Tract: Adrenal glands are normal. Three calculi right kidney mid to lower pole measuring 1-2 mm each. On the left there is severe dilatation of the left renal pelvis due to a UPJ stone. Renal pelvis  measures 16 mm with hydronephrosis and perinephric inflammation. Ureteral pelvic junction stone measures 12 mm. Tiny focus of air in the bladder suggests instrumentation but can also represent emphysematous cystitis. Correlate clinically. Ileocolic ligament suspected as well. Stomach/Bowel: Small hiatal hernia. Nonobstructive gas pattern. No significant abnormalities involving small or large bowel. Numerous calcifications in the otherwise normal appearing appendix. Vascular/Lymphatic: No acute findings Reproductive: No significant abnormalities Other: No ascites Musculoskeletal: No acute abnormalities IMPRESSION: Severe hydronephrosis on the left due to ureteral pelvic junction stone. Electronically Signed   By: Esperanza Heir M.D.   On: 04/14/2015 14:15    Scheduled Meds: . [MAR Hold] cefTRIAXone (ROCEPHIN)  IV  1 g Intravenous Q24H  . [MAR Hold] senna-docusate  1 tablet Oral QHS  . [MAR Hold] tamsulosin  0.4 mg Oral Daily   Continuous Infusions: . sodium chloride 100 mL/hr at 04/14/15 1710  . lactated ringers 75 mL/hr at 04/15/15 1108   Antibiotics Given (last 72 hours)    None      Principal Problem:   Acute unilateral obstructive uropathy Active Problems:   Hydronephrosis of left kidney   Hyperkalemia   Leucocytosis   UTI (urinary tract infection)   Nausea    Time spent:    [facility]  [facility] Pager [phone]. If 7PM-7AM, please contact night-coverage at [email or web address], password [facility] 04/15/2015, 11:45 AM  LOS: 1 day

## 2015-04-16 DIAGNOSIS — N139 Obstructive and reflux uropathy, unspecified: Secondary | ICD-10-CM

## 2015-04-16 DIAGNOSIS — D72829 Elevated white blood cell count, unspecified: Secondary | ICD-10-CM

## 2015-04-16 DIAGNOSIS — N1330 Unspecified hydronephrosis: Secondary | ICD-10-CM

## 2015-04-16 DIAGNOSIS — N132 Hydronephrosis with renal and ureteral calculous obstruction: Secondary | ICD-10-CM

## 2015-04-16 LAB — BASIC METABOLIC PANEL
Anion gap: 7 (ref 5–15)
BUN: 26 mg/dL — AB (ref 6–20)
CALCIUM: 8.9 mg/dL (ref 8.9–10.3)
CO2: 26 mmol/L (ref 22–32)
Chloride: 105 mmol/L (ref 101–111)
Creatinine, Ser: 1.04 mg/dL — ABNORMAL HIGH (ref 0.44–1.00)
GFR calc Af Amer: >60 mL/min (ref >60)
Glucose, Bld: 137 mg/dL — ABNORMAL HIGH (ref 65–99)
POTASSIUM: 4 mmol/L (ref 3.5–5.1)
SODIUM: 138 mmol/L (ref 135–145)

## 2015-04-16 LAB — CBC
HCT: 33.9 % — ABNORMAL LOW (ref 36.0–46.0)
Hemoglobin: 11.3 g/dL — ABNORMAL LOW (ref 12.0–15.0)
MCH: 29.5 pg (ref 26.0–34.0)
MCHC: 33.3 g/dL (ref 30.0–36.0)
MCV: 88.5 fL (ref 78.0–100.0)
PLATELETS: 91 10*3/uL — AB (ref 150–400)
RBC: 3.83 MIL/uL — ABNORMAL LOW (ref 3.87–5.11)
RDW: 12.8 % (ref 11.5–15.5)
WBC: 13.8 10*3/uL — AB (ref 4.0–10.5)

## 2015-04-16 MED ORDER — TRAMADOL HCL 50 MG PO TABS: 50.0000 mg | ORAL_TABLET | Freq: Two times a day (BID) | ORAL | Status: DC | PRN

## 2015-04-16 MED ORDER — CEFUROXIME AXETIL 500 MG PO TABS: 500.0000 mg | ORAL_TABLET | Freq: Two times a day (BID) | ORAL | Status: DC

## 2015-04-16 NOTE — Progress Notes (Signed)
[facility] PROGRESS NOTE  Christina Sawyer WUJ:[id number] DOB: 06-Jun-1960 DOA: 04/14/2015 PCP: Pcp Not In System  Assessment/Plan: 1. Obstructive uropathy -Christina Sawyer is a 55 year old female presented with complaints of left-sided flank pain, further workup with CT scan of abdomen and pelvis revealed an obstructing left UPJ stone with severe left-sided hydronephrosis. -Urology consulted -On 04/15/2015 she underwent cystoscopy with left retrograde ureteropyelogram and placement of stent.  2.  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 -CT scan revealed left UPJ stone with severe left-sided hydronephrosis -Status post cystoscopy with stent placement -Pending repeat BMP at time of dictation -Continue IV fluid resuscitation   Code Status: Full code Family Communication:  Disposition Plan: Patient wishing to speak with her urologist prior to discharge. Pending a.m. BMP.    Consultants:  Urology  Procedures: Cystoscopy with left retrograde ureteral pyelogram and placement of double J stent and left ureteral stent.  Antibiotics:  Ceftriaxone  HPI/Subjective: Christina Sawyer is a 55 year old female with history of rectal incontinence status post stenting, admitted to the medicine service on 04/14/2015 when she presented with complaints of left flank pain associated with fevers and chills. A CT scan performed on 04/14/2015 revealed severe hydronephrosis involving left kidney with associated ureteropelvic junction stone. She was started on IV fluids. Labs revealed acute kidney injury likely secondary to postobstructive nephropathy. Dr. Isabel Caprice of urology was consulted, evaluated patient on 04/14/2015. On 04/15/2015 she underwent cystoscopy with left retrograde ureteral pyelogram and placement of double J stent and left ureteral stent. She tolerated procedure well there were no immediate  complications. By 04/16/2015 she showed clinical improvement and was tolerating by mouth intake.  Objective: Filed Vitals:   04/16/15 0201 04/16/15 0543  BP: 99/65 126/69  Pulse: 74 62  Temp: 98.6 F (37 C) 98.2 F (36.8 C)  Resp: 20 20    Intake/Output Summary (Last 24 hours) at 04/16/15 0823 Last data filed at 04/16/15 0543  Gross per 24 hour  Intake    820 ml  Output    900 ml  Net    -80 ml   Filed Weights   04/14/15 1652 04/15/15 0511 04/16/15 0543  Weight: 66.815 kg (147 lb 4.8 oz) 67.359 kg (148 lb 8 oz) 69.037 kg (152 lb 3.2 oz)    Exam:   General:  She is in no acute distress awake and alert, states feeling better  Cardiovascular: RRR Nl S1S2  Respiratory: CTA b/l no wheezing rhonchi rales  Abdomen: There is mild generalized pain to palpation, positive BS   Musculoskeletal: No edema  Data Reviewed: Basic Metabolic Panel:  Recent Labs Lab 04/14/15 1312 04/14/15 1511 04/14/15 1952 04/15/15 0548  NA 142 140 140 142  K 3.8 5.4* 4.0 4.2  CL 104 104 106 106  CO2  --   --  26 27  GLUCOSE 150* 157* 150* 130*  BUN 22* 31* 22* 31*  CREATININE 1.00 1.10* 1.23* 1.79*  CALCIUM  --   --  9.3 9.0   Liver Function Tests:  Recent Labs Lab 04/14/15 1952 04/15/15 0548  AST 24 26  ALT 15 14  ALKPHOS 112 89  BILITOT 0.5 1.0  PROT 7.0 6.3*  ALBUMIN 3.9 3.4*   No results for input(s): LIPASE, AMYLASE in the last 168 hours. No results for input(s): AMMONIA in the last 168 hours. CBC:  Recent Labs Lab 04/14/15 1250 04/14/15 1312 04/14/15 1511 04/15/15 0548 04/16/15 9562  WBC 12.3*  --   --  14.9* 13.8*  NEUTROABS 10.8*  --   --   --   --   HGB 13.7 14.6 14.6 12.7 11.3*  HCT 40.6 43.0 43.0 37.6 33.9*  MCV 87.1  --   --  88.3 88.5  PLT 144*  --   --  105* 91*   Cardiac Enzymes: No results for input(s): CKTOTAL, CKMB, CKMBINDEX, TROPONINI in the last 168 hours. BNP (last 3 results) No results for input(s): BNP in the last 8760 hours.  ProBNP  (last 3 results) No results for input(s): PROBNP in the last 8760 hours.  CBG: No results for input(s): GLUCAP in the last 168 hours.  Recent Results (from the past 240 hour(s))  Urine culture     Status: None (Preliminary result)   Collection Time: 04/14/15  1:32 PM  Result Value Ref Range Status   Specimen Description URINE, CLEAN CATCH  Final   Special Requests NONE  Final   Culture   Final    >=100,000 COLONIES/mL GRAM NEGATIVE RODS Performed at [facility]    Report Status PENDING  Incomplete  Surgical pcr screen     Status: None   Collection Time: 04/15/15 10:10 AM  Result Value Ref Range Status   MRSA, PCR NEGATIVE NEGATIVE Final   Staphylococcus aureus NEGATIVE NEGATIVE Final    Comment:        The Xpert SA Assay (FDA approved for NASAL specimens in patients over 67 years of age), is one component of a comprehensive surveillance program.  Test performance has been validated by [facility] for patients greater than or equal to 58 year old. It is not intended to diagnose infection nor to guide or monitor treatment.      Studies: Dg Retrograde Pyelogram  04/15/2015  CLINICAL DATA:  55 year old female with a history of left flank pain. Prior CT 04/14/2015 demonstrates left-sided nephrolithiasis. EXAM: RETROGRADE PYELOGRAM COMPARISON:  CT 04/14/2015 FINDINGS: Limited fluoroscopic spot images during retrograde pyelogram. Initial image demonstrates cystoscope projecting over the anatomic pelvis. There is then cannulation of the left ureteral orifice and retrograde infusion of contrast. Incomplete opacification of the left collecting system, with evidence of pelvicaliectasis. Incomplete visualization of the left ureter. Rounded filling defect at the proximal left ureter/ ureteropelvic junction, correlating to the stone identified on prior CT. Final images demonstrate placement of a left-sided ureteral stent. IMPRESSION: Left retrograde pyelogram demonstrates obstructive  stone at the left ureteropelvic junction and associated left hydronephrosis, corresponding to prior CT findings. At the conclusion, patient is status post left ureteral straight stent placement. Please refer to the dictated operative report for full details of intraoperative findings and procedure. Signed, Yvone Neu. Loreta Ave, DO [facility] Radiology Electronically Signed   By: Gilmer Mor D.O.   On: 04/15/2015 12:58   Ct Renal Stone Study  04/14/2015  CLINICAL DATA:  LEFT FLANK PAIN THAT STARTED THIS AM ABOUT 9AM, DENIES INJURY EXAM: CT ABDOMEN AND PELVIS WITHOUT CONTRAST TECHNIQUE: Multidetector CT imaging of the abdomen and pelvis was performed following the standard protocol without IV contrast. COMPARISON:  None. FINDINGS: Lower chest:  Normal Hepatobiliary: The status post cholecystectomy Pancreas: Normal Spleen: Normal Adrenals/Urinary Tract: Adrenal glands are normal. Three calculi right kidney mid to lower pole measuring 1-2 mm each. On the left there is severe dilatation of the left renal pelvis due to a UPJ stone. Renal pelvis measures 16 mm with hydronephrosis and perinephric inflammation. Ureteral pelvic junction  stone measures 12 mm. Tiny focus of air in the bladder suggests instrumentation but can also represent emphysematous cystitis. Correlate clinically. Ileocolic ligament suspected as well. Stomach/Bowel: Small hiatal hernia. Nonobstructive gas pattern. No significant abnormalities involving small or large bowel. Numerous calcifications in the otherwise normal appearing appendix. Vascular/Lymphatic: No acute findings Reproductive: No significant abnormalities Other: No ascites Musculoskeletal: No acute abnormalities IMPRESSION: Severe hydronephrosis on the left due to ureteral pelvic junction stone. Electronically Signed   By: Esperanza Heir M.D.   On: 04/14/2015 14:15    Scheduled Meds: . atorvastatin  40 mg Oral QHS  . cefTRIAXone  (ROCEPHIN)  IV  1 g Intravenous Q24H  . senna-docusate  1 tablet Oral QHS  . tamsulosin  0.4 mg Oral Daily   Continuous Infusions: . sodium chloride 100 mL/hr at 04/14/15 1710    Principal Problem:   Acute unilateral obstructive uropathy Active Problems:   Hydronephrosis of left kidney   Hyperkalemia   Leucocytosis   UTI (urinary tract infection)   Nausea    Time spent:     Jeralyn Bennett  [facility] Pager [phone]. If 7PM-7AM, please contact night-coverage at [email or web address], password [facility] 04/16/2015, 8:23 AM  LOS: 2 days

## 2015-04-16 NOTE — Progress Notes (Signed)
1 Day Post-Op Subjective: Patient reports no flank pain. She has mild suprapubic pain and urgency. No fevers. Creatinine improved to 1 from 1.8 yesterday.  Objective: Vital signs in last 24 hours: Temp:  [98 F (36.7 C)-98.6 F (37 C)] 98.2 F (36.8 C) (02/01 0543) Pulse Rate:  [62-89] 62 (02/01 0543) Resp:  [16-20] 20 (02/01 0543) BP: (96-126)/(58-79) 126/69 mmHg (02/01 0543) SpO2:  [93 %-98 %] 98 % (02/01 0543) Weight:  [69.037 kg (152 lb 3.2 oz)] 69.037 kg (152 lb 3.2 oz) (02/01 0543)  Intake/Output from previous day: 01/31 0701 - 02/01 0700 In: 820 [P.O.:120; I.V.:700] Out: 900 [Urine:900] Intake/Output this shift:    Physical Exam:  General:alert, cooperative and appears stated age GI: soft, non tender, normal bowel sounds, no palpable masses, no organomegaly, no inguinal hernia Female genitalia: not done Extremities: extremities normal, atraumatic, no cyanosis or edema  Lab Results:  Recent Labs  04/14/15 1511 04/15/15 0548 04/16/15 0552  HGB 14.6 12.7 11.3*  HCT 43.0 37.6 33.9*   BMET  Recent Labs  04/15/15 0548 04/16/15 1242  NA 142 138  K 4.2 4.0  CL 106 105  CO2 27 26  GLUCOSE 130* 137*  BUN 31* 26*  CREATININE 1.79* 1.04*  CALCIUM 9.0 8.9    Recent Labs  04/15/15 0548  INR 1.23   No results for input(s): LABURIN in the last 72 hours. Results for orders placed or performed during the hospital encounter of 04/14/15  Urine culture     Status: None (Preliminary result)   Collection Time: 04/14/15  1:32 PM  Result Value Ref Range Status   Specimen Description URINE, CLEAN CATCH  Final   Special Requests NONE  Final   Culture   Final    >=100,000 COLONIES/mL GRAM NEGATIVE RODS IDENTIFICATION AND SUSCEPTIBILITIES TO FOLLOW Performed at [facility]    Report Status PENDING  Incomplete  Surgical pcr screen     Status: None   Collection Time: 04/15/15 10:10 AM  Result Value Ref Range Status   MRSA, PCR NEGATIVE NEGATIVE Final   Staphylococcus aureus NEGATIVE NEGATIVE Final    Comment:        The Xpert SA Assay (FDA approved for NASAL specimens in patients over 48 years of age), is one component of a comprehensive surveillance program.  Test performance has been validated by [facility] for patients greater than or equal to 55 year old. It is not intended to diagnose infection nor to guide or monitor treatment.     Studies/Results: Dg Retrograde Pyelogram  04/15/2015  CLINICAL DATA:  55 year old female with a history of left flank pain. Prior CT 04/14/2015 demonstrates left-sided nephrolithiasis. EXAM: RETROGRADE PYELOGRAM COMPARISON:  CT 04/14/2015 FINDINGS: Limited fluoroscopic spot images during retrograde pyelogram. Initial image demonstrates cystoscope projecting over the anatomic pelvis. There is then cannulation of the left ureteral orifice and retrograde infusion of contrast. Incomplete opacification of the left collecting system, with evidence of pelvicaliectasis. Incomplete visualization of the left ureter. Rounded filling defect at the proximal left ureter/ ureteropelvic junction, correlating to the stone identified on prior CT. Final images demonstrate placement of a left-sided ureteral stent. IMPRESSION: Left retrograde pyelogram demonstrates obstructive stone at the left ureteropelvic junction and associated left hydronephrosis, corresponding to prior CT findings. At the conclusion, patient is status post left ureteral straight stent placement. Please refer to the dictated operative report for full details of intraoperative findings and procedure. Signed, Yvone Neu. Loreta Ave, DO [facility] Radiology  Electronically Signed   By: Gilmer Mor D.O.   On: 04/15/2015 12:58   Ct Renal Stone Study  04/14/2015  CLINICAL DATA:  LEFT FLANK PAIN THAT STARTED THIS AM ABOUT 9AM, DENIES INJURY EXAM: CT ABDOMEN AND PELVIS WITHOUT CONTRAST TECHNIQUE: Multidetector CT imaging  of the abdomen and pelvis was performed following the standard protocol without IV contrast. COMPARISON:  None. FINDINGS: Lower chest:  Normal Hepatobiliary: The status post cholecystectomy Pancreas: Normal Spleen: Normal Adrenals/Urinary Tract: Adrenal glands are normal. Three calculi right kidney mid to lower pole measuring 1-2 mm each. On the left there is severe dilatation of the left renal pelvis due to a UPJ stone. Renal pelvis measures 16 mm with hydronephrosis and perinephric inflammation. Ureteral pelvic junction stone measures 12 mm. Tiny focus of air in the bladder suggests instrumentation but can also represent emphysematous cystitis. Correlate clinically. Ileocolic ligament suspected as well. Stomach/Bowel: Small hiatal hernia. Nonobstructive gas pattern. No significant abnormalities involving small or large bowel. Numerous calcifications in the otherwise normal appearing appendix. Vascular/Lymphatic: No acute findings Reproductive: No significant abnormalities Other: No ascites Musculoskeletal: No acute abnormalities IMPRESSION: Severe hydronephrosis on the left due to ureteral pelvic junction stone. Electronically Signed   By: Esperanza Heir M.D.   On: 04/14/2015 14:15    Assessment/Plan: 55yo with a left 1.2cm renal calculus s/p left stent placement  Recs: 1. ARF- resolved 2. Left renal calculus- pt has adequate pain control and can be discharged home. She will be scheduled for management of her stone per Dr. Retta Diones.    LOS: 2 days   Gearldean Lomanto L 04/16/2015, 1:37 PM

## 2015-04-16 NOTE — Care Management Note (Signed)
Case Management Note  Patient Details  Name: Christina Sawyer MRN: [id number] Date of Birth: 09-20-1960   Expected Discharge Date:  04/16/15               Expected Discharge Plan:  Home/Self Care  In-House Referral:  NA  Discharge planning Services  NA  Post Acute Care Choice:  NA Choice offered to:  NA  DME Arranged:    DME Agency:     HH Arranged:    HH Agency:     Status of Service:  Completed, signed off  Medicare Important Message Given:    Date Medicare IM Given:    Medicare IM give by:    Date Additional Medicare IM Given:    Additional Medicare Important Message give by:     If discussed at Long Length of Stay Meetings, dates discussed:    Additional Comments: Pt discharging home today with self care. Pt has arranged for transportation from [place]. Pt's DC info faxed to [place] [facility] [place] and [place] made aware pt will need Urology f/u. No further CM needs.  Malcolm Metro, RN 04/16/2015, 2:45 PM

## 2015-04-16 NOTE — Progress Notes (Signed)
AVS reviewed with patient.  Verbalized understanding of discharge instructions, physician follow-up, medications.  Patient's IV removed.  Site WNL.  Patient arranging ride for discharge home.

## 2015-04-16 NOTE — Addendum Note (Signed)
Addendum  created 04/16/15 1021 by Earleen Newport, CRNA   Modules edited: Clinical Notes   Clinical Notes:  File: [id number]

## 2015-04-16 NOTE — Discharge Summary (Signed)
Physician Discharge Summary  Christina Sawyer ZOX:[id number] DOB: 1960-06-22 DOA: 04/14/2015  PCP: Pcp Not In System  Admit date: 04/14/2015 Discharge date: 04/16/2015  Time spent: 35 minutes  Recommendations for Outpatient Follow-up:  1. 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 female with Past medical history of rectal incontinence status post stenting. Patient presents with complaints of 1 day onset of left flank pain which progressively worsened throughout the day. This was associated with nausea. She started having fever and chills. In the hospital. The pain is radiating from her left flank to her groin and is currently in the suprapubic area. She denies any bleeding in the urine. No diarrhea no constipation. She has undergone some stenting procedure with gynecology urologist in the past. [facility] She does not take any medication does not have any major surgeries other than mentioned above. She denies any prior history of stone prior history of recurrent UTI. She denies taking any medication or herbal supplements.  The patient is coming from home.  At her baseline ambulates without support And is independent for most of her ADL; manages her medication on her own.  Hospital Course:  Christina Sawyer is a 55 year old female with history of rectal incontinence status post stenting, admitted to the medicine service on 04/14/2015 when she presented  with complaints of left flank pain associated with fevers and chills. A CT scan performed on 04/14/2015 revealed severe hydronephrosis involving left kidney with associated ureteropelvic junction stone. She was started on IV fluids. Labs revealed acute kidney injury likely secondary to postobstructive nephropathy. Dr. Isabel Caprice of urology was consulted, evaluated patient on 04/14/2015. On 04/15/2015 she underwent cystoscopy with left retrograde ureteral pyelogram and placement of double J stent and left ureteral stent. She tolerated procedure well there were no immediate complications. By 04/16/2015 she showed clinical improvement and was tolerating by mouth intake. Labs reveal resolution to AKI, with Cr trending back down to 1.04. She was cleared by Urology and discharged home on 04/16/2015 in stable condition.   Procedures: Cystoscopy with left retrograde ureteral pyelogram and placement of double J stent and left ureteral stent.  Consultations:  Urology  Discharge Exam: Filed Vitals:   04/16/15 0543 04/16/15 1356  BP: 126/69 121/66  Pulse: 62 81  Temp: 98.2 F (36.8 C) 98.4 F (36.9 C)  Resp: 20 20     General: She is in no acute distress awake and alert, states feeling better  Cardiovascular: RRR Nl S1S2  Respiratory: CTA b/l no wheezing rhonchi rales  Abdomen: There is mild generalized pain to palpation, positive BS   Musculoskeletal: No edema  Discharge Instructions   Discharge Instructions    Call MD for:  difficulty breathing, headache or visual disturbances    Complete by:  As directed      Call MD for:  extreme fatigue    Complete by:  As directed      Call  MD for:  hives    Complete by:  As directed      Call MD for:  persistant dizziness or light-headedness    Complete by:  As directed      Call MD for:  persistant nausea and vomiting    Complete by:  As directed      Call MD for:  redness, tenderness, or signs of infection (pain, swelling, redness, odor or  green/yellow discharge around incision site)    Complete by:  As directed      Call MD for:  severe uncontrolled pain    Complete by:  As directed      Call MD for:  temperature >100.4    Complete by:  As directed      Call MD for:    Complete by:  As directed      Diet - low sodium heart healthy    Complete by:  As directed      Increase activity slowly    Complete by:  As directed           Current Discharge Medication List    START taking these medications   Details  cefUROXime (CEFTIN) 500 MG tablet Take 1 tablet (500 mg total) by mouth 2 (two) times daily with a meal. Qty: 12 tablet, Refills: 0    traMADol (ULTRAM) 50 MG tablet Take 1 tablet (50 mg total) by mouth every 12 (twelve) hours as needed. Qty: 10 tablet, Refills: 0      CONTINUE these medications which have NOT CHANGED   Details  atorvastatin (LIPITOR) 40 MG tablet Take 40 mg by mouth at bedtime.    conjugated estrogens (PREMARIN) vaginal cream Place 1 Applicatorful vaginally every 3 (three) days.    fluocinonide (LIDEX) 0.05 % external solution Apply 1 application topically 2 (two) times daily as needed (use in scalp).    metoCLOPramide (REGLAN) 10 MG tablet Take 5 mg by mouth 4 (four) times daily as needed for nausea.      STOP taking these medications     ibuprofen (ADVIL,MOTRIN) 800 MG tablet        Allergies  Allergen Reactions  . Stadol [Butorphanol] Other (See Comments)    "goes under"   Follow-up Information    Follow up with Chelsea Aus, MD.   Specialty:  Urology   Contact information:   [address] [place] [address] [phone]        The results of significant diagnostics from this hospitalization (including imaging, microbiology, ancillary and laboratory) are listed below for reference.    Significant Diagnostic Studies: Dg Retrograde Pyelogram  04/15/2015  CLINICAL DATA:  55 year old female with a history of left flank pain. Prior CT 04/14/2015 demonstrates  left-sided nephrolithiasis. EXAM: RETROGRADE PYELOGRAM COMPARISON:  CT 04/14/2015 FINDINGS: Limited fluoroscopic spot images during retrograde pyelogram. Initial image demonstrates cystoscope projecting over the anatomic pelvis. There is then cannulation of the left ureteral orifice and retrograde infusion of contrast. Incomplete opacification of the left collecting system, with evidence of pelvicaliectasis. Incomplete visualization of the left ureter. Rounded filling defect at the proximal left ureter/ ureteropelvic junction, correlating to the stone identified on prior CT. Final images demonstrate placement of a left-sided ureteral stent. IMPRESSION: Left retrograde pyelogram demonstrates obstructive stone at the left ureteropelvic junction and associated left hydronephrosis, corresponding to prior CT findings. At the conclusion, patient is status post left ureteral straight stent placement. Please refer to the dictated operative report for full details of intraoperative  findings and procedure. Signed, Yvone Neu. Loreta Ave, DO [facility] Radiology Electronically Signed   By: Gilmer Mor D.O.   On: 04/15/2015 12:58   Ct Renal Stone Study  04/14/2015  CLINICAL DATA:  LEFT FLANK PAIN THAT STARTED THIS AM ABOUT 9AM, DENIES INJURY EXAM: CT ABDOMEN AND PELVIS WITHOUT CONTRAST TECHNIQUE: Multidetector CT imaging of the abdomen and pelvis was performed following the standard protocol without IV contrast. COMPARISON:  None. FINDINGS: Lower chest:  Normal Hepatobiliary: The status post cholecystectomy Pancreas: Normal Spleen: Normal Adrenals/Urinary Tract: Adrenal glands are normal. Three calculi right kidney mid to lower pole measuring 1-2 mm each. On the left there is severe dilatation of the left renal pelvis due to a UPJ stone. Renal pelvis measures 16 mm with hydronephrosis and perinephric inflammation. Ureteral pelvic junction stone measures 12 mm. Tiny focus of air in  the bladder suggests instrumentation but can also represent emphysematous cystitis. Correlate clinically. Ileocolic ligament suspected as well. Stomach/Bowel: Small hiatal hernia. Nonobstructive gas pattern. No significant abnormalities involving small or large bowel. Numerous calcifications in the otherwise normal appearing appendix. Vascular/Lymphatic: No acute findings Reproductive: No significant abnormalities Other: No ascites Musculoskeletal: No acute abnormalities IMPRESSION: Severe hydronephrosis on the left due to ureteral pelvic junction stone. Electronically Signed   By: Esperanza Heir M.D.   On: 04/14/2015 14:15    Microbiology: Recent Results (from the past 240 hour(s))  Urine culture     Status: None (Preliminary result)   Collection Time: 04/14/15  1:32 PM  Result Value Ref Range Status   Specimen Description URINE, CLEAN CATCH  Final   Special Requests NONE  Final   Culture   Final    >=100,000 COLONIES/mL GRAM NEGATIVE RODS IDENTIFICATION AND SUSCEPTIBILITIES TO FOLLOW Performed at [facility]    Report Status PENDING  Incomplete  Surgical pcr screen     Status: None   Collection Time: 04/15/15 10:10 AM  Result Value Ref Range Status   MRSA, PCR NEGATIVE NEGATIVE Final   Staphylococcus aureus NEGATIVE NEGATIVE Final    Comment:        The Xpert SA Assay (FDA approved for NASAL specimens in patients over 31 years of age), is one component of a comprehensive surveillance program.  Test performance has been validated by [facility] for patients greater than or equal to 29 year old. It is not intended to diagnose infection nor to guide or monitor treatment.      Labs: Basic Metabolic Panel:  Recent Labs Lab 04/14/15 1312 04/14/15 1511 04/14/15 1952 04/15/15 0548 04/16/15 1242  NA 142 140 140 142 138  K 3.8 5.4* 4.0 4.2 4.0  CL 104 104 106 106 105  CO2  --   --  GLUCOSE 150* 157* 150* 130* 137*  BUN 22* 31* 22* 31* 26*  CREATININE  1.00 1.10* 1.23* 1.79* 1.04*  CALCIUM  --   --  9.3 9.0 8.9   Liver Function Tests:  Recent Labs Lab 04/14/15 1952 04/15/15 0548  AST 24 26  ALT 15 14  ALKPHOS 112 89  BILITOT 0.5 1.0  PROT 7.0 6.3*  ALBUMIN 3.9 3.4*   No results for input(s): LIPASE, AMYLASE in the last 168 hours. No results for input(s): AMMONIA in the last 168 hours. CBC:  Recent Labs Lab 04/14/15 1250 04/14/15 1312 04/14/15 1511 04/15/15 0548 04/16/15 0552  WBC 12.3*  --   --  14.9* 13.8*  NEUTROABS 10.8*  --   --   --   --  HGB 13.7 14.6 14.6 12.7 11.3*  HCT 40.6 43.0 43.0 37.6 33.9*  MCV 87.1  --   --  88.3 88.5  PLT 144*  --   --  105* 91*   Cardiac Enzymes: No results for input(s): CKTOTAL, CKMB, CKMBINDEX, TROPONINI in the last 168 hours. BNP: BNP (last 3 results) No results for input(s): BNP in the last 8760 hours.  ProBNP (last 3 results) No results for input(s): PROBNP in the last 8760 hours.  CBG: No results for input(s): GLUCAP in the last 168 hours.     Signed:  Jeralyn Bennett MD.  [facility] 04/16/2015, 2:18 PM

## 2015-04-16 NOTE — Anesthesia Postprocedure Evaluation (Signed)
Anesthesia Post Note  Patient: Christina Sawyer  Procedure(s) Performed: Procedure(s) (LRB): CYSTOSCOPY WITH RETROGRADE PYELOGRAM/URETERAL STENT PLACEMENT (Left)  Patient location during evaluation: Nursing Unit Anesthesia Type: General Level of consciousness: awake and alert and oriented Pain management: pain level controlled Vital Signs Assessment: post-procedure vital signs reviewed and stable Respiratory status: spontaneous breathing and respiratory function stable Cardiovascular status: stable Anesthetic complications: no    Last Vitals:  Filed Vitals:   04/16/15 0201 04/16/15 0543  BP: 99/65 126/69  Pulse: 74 62  Temp: 37 C 36.8 C  Resp: 20 20    Last Pain:  Filed Vitals:   04/16/15 0909  PainSc: 2                  Brittney Caraway A

## 2015-04-17 ENCOUNTER — Encounter (HOSPITAL_COMMUNITY): Payer: Self-pay | Admitting: Urology

## 2015-04-17 LAB — URINE CULTURE: Culture: >=100000

## 2015-04-17 NOTE — Progress Notes (Signed)
Patient escorted by NT to main entrance for discharge.  Patient provided transportation by friend.  Patient stable at time of discharge.

## 2015-05-15 ENCOUNTER — Other Ambulatory Visit: Payer: Self-pay | Admitting: Urology

## 2015-05-20 ENCOUNTER — Encounter (HOSPITAL_COMMUNITY): Payer: Self-pay

## 2015-05-20 ENCOUNTER — Encounter (HOSPITAL_COMMUNITY)
Admission: RE | Admit: 2015-05-20 | Discharge: 2015-05-20 | Disposition: A | Discharge status: Home / Self Care | Payer: No Typology Code available for payment source | Source: Ambulatory Visit | Attending: Urology | Admitting: Urology

## 2015-05-20 ENCOUNTER — Encounter (INDEPENDENT_AMBULATORY_CARE_PROVIDER_SITE_OTHER): Payer: Self-pay

## 2015-05-20 DIAGNOSIS — N201 Calculus of ureter: Secondary | ICD-10-CM | POA: Diagnosis not present

## 2015-05-20 DIAGNOSIS — N200 Calculus of kidney: Secondary | ICD-10-CM | POA: Diagnosis present

## 2015-05-20 HISTORY — DX: Other complications of anesthesia, initial encounter: T88.59XA

## 2015-05-20 HISTORY — DX: Other specified postprocedural states: Z98.890

## 2015-05-20 HISTORY — DX: Other specified postprocedural states: R11.2

## 2015-05-20 HISTORY — DX: Pure hypercholesterolemia, unspecified: E78.00

## 2015-05-20 HISTORY — DX: Respiratory tuberculosis unspecified: A15.9

## 2015-05-20 HISTORY — DX: Calculus of kidney: N20.0

## 2015-05-20 HISTORY — DX: Adverse effect of unspecified anesthetic, initial encounter: T41.45XA

## 2015-05-20 HISTORY — DX: Unspecified urinary incontinence: R32

## 2015-05-20 LAB — CBC
HEMATOCRIT: 39.4 % (ref 36.0–46.0)
HEMOGLOBIN: 12.8 g/dL (ref 12.0–15.0)
MCH: 28.8 pg (ref 26.0–34.0)
MCHC: 32.5 g/dL (ref 30.0–36.0)
MCV: 88.7 fL (ref 78.0–100.0)
Platelets: 152 10*3/uL (ref 150–400)
RBC: 4.44 MIL/uL (ref 3.87–5.11)
RDW: 12.6 % (ref 11.5–15.5)
WBC: 5.5 10*3/uL (ref 4.0–10.5)

## 2015-05-20 NOTE — Patient Instructions (Addendum)
Christina HartshornLinda Sawyer  05/20/2015   Your procedure is scheduled on: 05-22-15  [name] 3rd floor to  Short Stay Center at   0730 AM.  Call this number if you have problems the morning of surgery [phone]   Remember: ONLY 1 PERSON MAY GO WITH YOU TO SHORT STAY TO GET  READY MORNING OF YOUR SURGERY.  Do not eat food or drink liquids :After Midnight.     Take these medicines the morning of surgery with A SIP OF WATER: Bactrim. Tramadol-if need. Bring Interstim remote. DO NOT TAKE ANY DIABETIC MEDICATIONS DAY OF YOUR SURGERY                               You may not have any metal on your body including hair pins and              piercings  Do not wear jewelry, make-up, lotions, powders or perfumes, deodorant             Do not wear nail polish.  Do not shave  48 hours prior to surgery.              Men may shave face and neck.   Do not bring valuables to the hospital. [name] IS NOT             RESPONSIBLE   FOR VALUABLES.  Contacts, dentures or bridgework may not be worn into surgery.  Leave suitcase in the car. After surgery it may be brought to your room.     Patients discharged the day of surgery will not be allowed to drive home.  Name and phone number of your driver: Christina Sawyer-will give AM of  Special Instructions: N/A              Please read over the following fact sheets you were given: _____________________________________________________________________             [facility] Pre[facility] Before surgery, you can play an important role.  Because skin is not sterile, your skin needs to be as free of germs as possible.  You can reduce the number of germs on your skin by washing with CHG (chlorahexidine gluconate) soap before surgery.  CHG is an antiseptic cleaner which kills germs and bonds with the skin to continue killing germs even after washing. Please DO NOT use if you have an  allergy to CHG or antibacterial soaps.  If your skin becomes reddened/irritated stop using the CHG and inform your nurse when you arrive at Short Stay. Do not shave (including legs and underarms) for at least 48 hours prior to the first CHG shower.  You may shave your face/neck. Please follow these instructions carefully:  1.  Shower with CHG Soap the night before surgery and the  morning of Surgery.  2.  If you choose to wash your hair, wash your hair first as usual with your  normal  shampoo.  3.  After you shampoo, rinse your hair and body thoroughly to remove the  shampoo.                           4.  Use CHG as you would any other liquid soap.  You can  apply chg directly  to the skin and wash                       Gently with a scrungie or clean washcloth.  5.  Apply the CHG Soap to your body ONLY FROM THE NECK DOWN.   Do not use on face/ open                           Wound or open sores. Avoid contact with eyes, ears mouth and genitals (private parts).                       Wash face,  Genitals (private parts) with your normal soap.             6.  Wash thoroughly, paying special attention to the area where your surgery  will be performed.  7.  Thoroughly rinse your body with warm water from the neck down.  8.  DO NOT shower/wash with your normal soap after using and rinsing off  the CHG Soap.                9.  Pat yourself dry with a clean towel.            10.  Wear clean pajamas.            11.  Place clean sheets on your bed the night of your first shower and do not  sleep with pets. Day of Surgery : Do not apply any lotions/deodorants the morning of surgery.  Please wear clean clothes to the hospital/surgery center.  FAILURE TO FOLLOW THESE INSTRUCTIONS MAY RESULT IN THE CANCELLATION OF YOUR SURGERY PATIENT SIGNATURE_________________________________  NURSE SIGNATURE__________________________________  ________________________________________________________________________

## 2015-05-20 NOTE — Pre-Procedure Instructions (Signed)
EKG 2'17

## 2015-05-21 MED ORDER — GENTAMICIN SULFATE 40 MG/ML IJ SOLN
5.0000 mg/kg | INTRAVENOUS | Status: AC
  Administered 2015-05-22: 300 mg via INTRAVENOUS
  Filled 2015-05-21: qty 8.75

## 2015-05-22 ENCOUNTER — Encounter (HOSPITAL_COMMUNITY): Payer: Self-pay | Admitting: Certified Registered Nurse Anesthetist

## 2015-05-22 ENCOUNTER — Encounter (HOSPITAL_COMMUNITY)
Admission: RE | Disposition: A | Discharge status: Home / Self Care | Payer: Self-pay | Source: Ambulatory Visit | Attending: Urology

## 2015-05-22 ENCOUNTER — Ambulatory Visit (HOSPITAL_COMMUNITY): Payer: No Typology Code available for payment source | Admitting: Certified Registered Nurse Anesthetist

## 2015-05-22 ENCOUNTER — Ambulatory Visit (HOSPITAL_COMMUNITY)
Admission: RE | Admit: 2015-05-22 | Discharge: 2015-05-22 | Disposition: A | Discharge status: Home / Self Care | Payer: No Typology Code available for payment source | Source: Ambulatory Visit | Attending: Urology | Admitting: Urology

## 2015-05-22 DIAGNOSIS — N201 Calculus of ureter: Secondary | ICD-10-CM | POA: Insufficient documentation

## 2015-05-22 DIAGNOSIS — N139 Obstructive and reflux uropathy, unspecified: Secondary | ICD-10-CM

## 2015-05-22 HISTORY — PX: CYSTOSCOPY WITH URETEROSCOPY AND STENT PLACEMENT: SHX6377

## 2015-05-22 HISTORY — PX: HOLMIUM LASER APPLICATION: SHX5852

## 2015-05-22 SURGERY — CYSTOURETEROSCOPY, WITH STENT INSERTION
Anesthesia: General | Laterality: Left

## 2015-05-22 MED ORDER — FENTANYL CITRATE (PF) 100 MCG/2ML IJ SOLN
25.0000 ug | INTRAMUSCULAR | Status: DC | PRN
Start: 2015-05-22 — End: 2015-05-22
  Administered 2015-05-22 (×2): 50 ug via INTRAVENOUS

## 2015-05-22 MED ORDER — KETOROLAC TROMETHAMINE 30 MG/ML IJ SOLN
INTRAMUSCULAR | Status: AC
  Filled 2015-05-22: qty 1

## 2015-05-22 MED ORDER — MIDAZOLAM HCL 2 MG/2ML IJ SOLN
INTRAMUSCULAR | Status: AC
  Filled 2015-05-22: qty 2

## 2015-05-22 MED ORDER — PROPOFOL 10 MG/ML IV BOLUS
INTRAVENOUS | Status: DC | PRN
  Administered 2015-05-22: 140 mg via INTRAVENOUS
  Administered 2015-05-22: 30 mg via INTRAVENOUS

## 2015-05-22 MED ORDER — LACTATED RINGERS IV SOLN
INTRAVENOUS | Status: DC
Start: 2015-05-22 — End: 2015-05-22
  Administered 2015-05-22: 1000 mL via INTRAVENOUS
  Administered 2015-05-22: 11:00:00 via INTRAVENOUS

## 2015-05-22 MED ORDER — FENTANYL CITRATE (PF) 100 MCG/2ML IJ SOLN
INTRAMUSCULAR | Status: AC
Start: 2015-05-22 — End: 2015-05-22
  Filled 2015-05-22: qty 2

## 2015-05-22 MED ORDER — CEPHALEXIN 500 MG PO CAPS: 500.0000 mg | ORAL_CAPSULE | Freq: Two times a day (BID) | ORAL | Status: DC

## 2015-05-22 MED ORDER — PROPOFOL 10 MG/ML IV BOLUS
INTRAVENOUS | Status: AC
  Filled 2015-05-22: qty 20

## 2015-05-22 MED ORDER — FENTANYL CITRATE (PF) 100 MCG/2ML IJ SOLN
INTRAMUSCULAR | Status: DC
Start: 2015-05-22 — End: 2015-05-22
  Filled 2015-05-22: qty 2

## 2015-05-22 MED ORDER — DEXAMETHASONE SODIUM PHOSPHATE 10 MG/ML IJ SOLN
INTRAMUSCULAR | Status: DC | PRN
  Administered 2015-05-22: 10 mg via INTRAVENOUS

## 2015-05-22 MED ORDER — TRAMADOL HCL 50 MG PO TABS: 50.0000 mg | ORAL_TABLET | Freq: Two times a day (BID) | ORAL | Status: DC | PRN

## 2015-05-22 MED ORDER — PROMETHAZINE HCL 25 MG/ML IJ SOLN
INTRAMUSCULAR | Status: AC
  Filled 2015-05-22: qty 1

## 2015-05-22 MED ORDER — FENTANYL CITRATE (PF) 100 MCG/2ML IJ SOLN
INTRAMUSCULAR | Status: AC
  Filled 2015-05-22: qty 2

## 2015-05-22 MED ORDER — SODIUM CHLORIDE 0.9 % IR SOLN
Status: DC | PRN
  Administered 2015-05-22: 5000 mL

## 2015-05-22 MED ORDER — LIDOCAINE HCL (CARDIAC) 20 MG/ML IV SOLN
INTRAVENOUS | Status: DC | PRN
  Administered 2015-05-22: 50 mg via INTRAVENOUS

## 2015-05-22 MED ORDER — KETOROLAC TROMETHAMINE 30 MG/ML IJ SOLN
30.0000 mg | Freq: Once | INTRAMUSCULAR | Status: AC | PRN
  Administered 2015-05-22: 30 mg via INTRAVENOUS

## 2015-05-22 MED ORDER — MIDAZOLAM HCL 5 MG/5ML IJ SOLN
INTRAMUSCULAR | Status: DC | PRN
  Administered 2015-05-22: 2 mg via INTRAVENOUS

## 2015-05-22 MED ORDER — FENTANYL CITRATE (PF) 100 MCG/2ML IJ SOLN
INTRAMUSCULAR | Status: DC | PRN
  Administered 2015-05-22: 50 ug via INTRAVENOUS

## 2015-05-22 MED ORDER — ONDANSETRON HCL 4 MG/2ML IJ SOLN
INTRAMUSCULAR | Status: DC | PRN
  Administered 2015-05-22: 4 mg via INTRAVENOUS

## 2015-05-22 MED ORDER — PROMETHAZINE HCL 25 MG/ML IJ SOLN
6.2500 mg | INTRAMUSCULAR | Status: DC | PRN
  Administered 2015-05-22: 6.25 mg via INTRAVENOUS

## 2015-05-22 SURGICAL SUPPLY — 21 items
BAG URO CATCHER STRL LF (MISCELLANEOUS) ×2 IMPLANT
BASKET LASER NITINOL 1.9FR (BASKET) ×2 IMPLANT
BASKET ZERO TIP NITINOL 2.4FR (BASKET) IMPLANT
CATH INTERMIT  6FR 70CM (CATHETERS) ×2 IMPLANT
CLOTH BEACON ORANGE TIMEOUT ST (SAFETY) ×2 IMPLANT
FIBER LASER FLEXIVA 200 (UROLOGICAL SUPPLIES) ×2 IMPLANT
FIBER LASER FLEXIVA 365 (UROLOGICAL SUPPLIES) IMPLANT
FIBER LASER TRAC TIP (UROLOGICAL SUPPLIES) IMPLANT
GLOVE BIOGEL M 8.0 STRL (GLOVE) ×6 IMPLANT
GOWN STRL REUS W/ TWL XL LVL3 (GOWN DISPOSABLE) IMPLANT
GOWN STRL REUS W/TWL LRG LVL3 (GOWN DISPOSABLE) ×4 IMPLANT
GOWN STRL REUS W/TWL XL LVL3 (GOWN DISPOSABLE)
GUIDEWIRE ANG ZIPWIRE 038X150 (WIRE) IMPLANT
GUIDEWIRE STR DUAL SENSOR (WIRE) ×2 IMPLANT
IV NS 1000ML (IV SOLUTION) ×1
IV NS 1000ML BAXH (IV SOLUTION) ×1 IMPLANT
MANIFOLD NEPTUNE II (INSTRUMENTS) ×2 IMPLANT
PACK CYSTO (CUSTOM PROCEDURE TRAY) ×2 IMPLANT
SHEATH ACCESS URETERAL 38CM (SHEATH) ×2 IMPLANT
STENT URET 6FRX24 CONTOUR (STENTS) ×2 IMPLANT
TUBING CONNECTING 10 (TUBING) ×2 IMPLANT

## 2015-05-22 NOTE — Discharge Instructions (Signed)
POSTOPERATIVE CARE AFTER URETEROSCOPY  Stent management  *Stents are often left in after ureteroscopy and stone treatment. If left in, they often cause urinary frequency, urgency, occasional blood in the urine, as well as flank discomfort with urination. These are all expected issues, and should resolve after the stent is removed. *Often times, a small thread is left on the end of the stent, and brought out through the urethra. If so, this is used to remove the stent, making it unnecessary to look in the bladder with a scope in the office to remove the stent. If a thread is left on, did not pull on it until instructed. Pull string to remove stent Tuesday  Diet  Once you have adequately recovered from anesthesia, you may gradually advance your diet, as tolerated, to your regular diet.  Activities  You may gradually increase your activities to your normal unrestricted level the day following your procedure.  Medications  You should resume all preoperative medications. If you are on aspirin-like compounds, you should not resume these until the blood clears from your urine. If given an antibiotic by the surgeon, take these until they are completed. You may also be given, if you have a stent, medications to decrease the urinary frequency and urgency.  Pain  After ureteroscopy, there may be some pain on the side of the scope. Take your pain medicine for this. Usually, this pain resolves within a day or 2.  Fever  Please report any fever over 100 to the doctor. General Anesthesia, Adult, Care After Refer to this sheet in the next few weeks. These instructions provide you with information on caring for yourself after your procedure. Your health care provider may also give you more specific instructions. Your treatment has been planned according to current medical practices, but problems sometimes occur. Call your health care provider if you have any problems or questions after your  procedure. WHAT TO EXPECT AFTER THE PROCEDURE After the procedure, it is typical to experience:  Sleepiness.  Nausea and vomiting. HOME CARE INSTRUCTIONS  For the first 24 hours after general anesthesia:  Have a responsible person with you.  Do not drive a car. If you are alone, do not take public transportation.  Do not drink alcohol.  Do not take medicine that has not been prescribed by your health care provider.  Do not sign important papers or make important decisions.  You may resume a normal diet and activities as directed by your health care provider.  Change bandages (dressings) as directed.  If you have questions or problems that seem related to general anesthesia, call the hospital and ask for the anesthetist or anesthesiologist on call. SEEK MEDICAL CARE IF:  You have nausea and vomiting that continue the day after anesthesia.  You develop a rash. SEEK IMMEDIATE MEDICAL CARE IF:   You have difficulty breathing.  You have chest pain.  You have any allergic problems.   This information is not intended to replace advice given to you by your health care provider. Make sure you discuss any questions you have with your health care provider.   Document Released: 06/07/2000 Document Revised: 03/22/2014 Document Reviewed: 06/30/2011 Elsevier Interactive Patient Education [employer]2016 Elsevier Inc.

## 2015-05-22 NOTE — Progress Notes (Signed)
Pt has stent taped to leg/ informed to remove on tuesday

## 2015-05-22 NOTE — Anesthesia Postprocedure Evaluation (Signed)
Anesthesia Post Note  Patient: Catarina HartshornLinda Beavin  Procedure(s) Performed: Procedure(s) (LRB): CYSTOSCOPY WITH URETEROSCOPY, STONE BASKETRY AND STENT EXCHANGE (Left) HOLMIUM LASER APPLICATION (Left)  Patient location during evaluation: PACU Anesthesia Type: General Level of consciousness: awake and alert Pain management: pain level controlled Vital Signs Assessment: post-procedure vital signs reviewed and stable Respiratory status: spontaneous breathing, nonlabored ventilation, respiratory function stable and patient connected to nasal cannula oxygen Cardiovascular status: blood pressure returned to baseline and stable Postop Assessment: no signs of nausea or vomiting Anesthetic complications: no    Last Vitals:  Filed Vitals:   05/22/15 1140 05/22/15 1145  BP:  177/89  Pulse: 73 73  Temp:    Resp: 16 18    Last Pain:  Filed Vitals:   05/22/15 1151  PainSc: 7                  Makinzi Prieur S

## 2015-05-22 NOTE — Anesthesia Procedure Notes (Signed)
Procedure Name: LMA Insertion Performed by: Ensley Blas J Pre-anesthesia Checklist: Patient identified, Emergency Drugs available, Suction available, Patient being monitored and Timeout performed Patient Re-evaluated:Patient Re-evaluated prior to inductionOxygen Delivery Method: Circle system utilized Preoxygenation: Pre-oxygenation with 100% oxygen Intubation Type: IV induction Ventilation: Mask ventilation without difficulty LMA: LMA inserted LMA Size: 4.0 Number of attempts: 1 Placement Confirmation: positive ETCO2,  CO2 detector and breath sounds checked- equal and bilateral Tube secured with: Tape Dental Injury: Teeth and Oropharynx as per pre-operative assessment        

## 2015-05-22 NOTE — Anesthesia Preprocedure Evaluation (Addendum)
Anesthesia Evaluation  Patient identified by MRN, date of birth, ID band Patient awake    Reviewed: Allergy & Precautions, NPO status , Patient's Chart, lab work & pertinent test results  History of Anesthesia Complications (+) PONV  Airway Mallampati: II  TM Distance: >3 FB Neck ROM: Full    Dental no notable dental hx.    Pulmonary neg pulmonary ROS,    Pulmonary exam normal breath sounds clear to auscultation       Cardiovascular negative cardio ROS Normal cardiovascular exam Rhythm:Regular Rate:Normal     Neuro/Psych negative neurological ROS  negative psych ROS   GI/Hepatic negative GI ROS, Neg liver ROS,   Endo/Other  negative endocrine ROS  Renal/GU negative Renal ROS  negative genitourinary   Musculoskeletal negative musculoskeletal ROS (+)   Abdominal   Peds negative pediatric ROS (+)  Hematology negative hematology ROS (+)   Anesthesia Other Findings   Reproductive/Obstetrics negative OB ROS                             Anesthesia Physical Anesthesia Plan  ASA: II  Anesthesia Plan: General   Post-op Pain Management:    Induction: Intravenous  Airway Management Planned: LMA  Additional Equipment:   Intra-op Plan:   Post-operative Plan: Extubation in OR  Informed Consent: I have reviewed the patients History and Physical, chart, labs and discussed the procedure including the risks, benefits and alternatives for the proposed anesthesia with the patient or authorized representative who has indicated his/her understanding and acceptance.   Dental advisory given  Plan Discussed with: CRNA and Surgeon  Anesthesia Plan Comments:         Anesthesia Quick Evaluation  

## 2015-05-22 NOTE — H&P (Signed)
  H&P  Chief Complaint: Left kidney stone  History of Present Illness: Christina HartshornLinda Pelley is a 55 y.o. year old female who presents for ureteroscopic management of a 13 mm left UPJ stone. She originally presented with an infected/obstructing 13 mm stone on 04/15/2015 which was urgently stented. She completed appropriate abx therapy. She has been scheduled for ureteroscopic management. Recent urine c&s revealed Klebsiella infection, which is currently being treated with Bactrim.  Past Medical History  Diagnosis Date  . Complication of anesthesia     "Stadol caused over sedation"  . PONV (postoperative nausea and vomiting)   . Bladder incontinence     "interstim implant"- Right flank  . Hypercholesteremia   . Kidney stone     at present-has stent in place.  . Tuberculosis     "latent tuberculosis-tx. 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"    Past Surgical History  Procedure Laterality Date  . Hip surgery      right  . Appendectomy    . Cholecystectomy    . Anal sphincteroplasty N/A 12 2014  . Tubal ligation    . Cystoscopy w/ ureteral stent placement Left 04/15/2015    Procedure: CYSTOSCOPY WITH RETROGRADE PYELOGRAM/URETERAL STENT PLACEMENT;  Surgeon: Marcine MatarStephen Natahsa Marian, MD;  Location: [facility];  Service: Urology;  Laterality: Left;    Home Medications:  No prescriptions prior to admission    Allergies:  Allergies  Allergen Reactions  . Stadol [Butorphanol] Other (See Comments)    "goes under""over sedation"    No family history on file.  Social History:  reports that she has been passively smoking.  She does not have any smokeless tobacco history on file. She reports that she drinks about 0.6 oz of alcohol per week. She reports that she does not use illicit drugs.  ROS: A complete review of systems was performed.  All systems are negative except for pertinent findings as noted.  Physical Exam:  Vital signs in last 24 hours:   General:  Alert and oriented, No acute distress HEENT: Normocephalic,  atraumatic Neck: No JVD or lymphadenopathy Cardiovascular: Regular rate and rhythm Lungs: Clear bilaterally Abdomen: Soft, nontender, nondistended, no abdominal masses Back: No CVA tenderness Extremities: No edema Neurologic: Grossly intact  Laboratory Data:  No results found for this or any previous visit (from the past 24 hour(s)). No results found for this or any previous visit (from the past 240 hour(s)). Creatinine: No results for input(s): CREATININE in the last 168 hours.  Radiologic Imaging: No results found.  Impression/Assessment:  13 mm left renal stone--s/p stenting for obstruction/infection  Plan:  Cysto, left J2 stent extraction, left ureteroscopy, HLL, stone extraction, possible J2 stent replacement  Naydeen Speirs M 05/22/2015, 6:20 AM  Bertram MillardStephen M. Annis Lagoy MD

## 2015-05-22 NOTE — Transfer of Care (Signed)
Immediate Anesthesia Transfer of Care Note  Patient: Christina HartshornLinda Sawyer  Procedure(s) Performed: Procedure(s): CYSTOSCOPY WITH URETEROSCOPY, STONE BASKETRY AND STENT EXCHANGE (Left) HOLMIUM LASER APPLICATION (Left)  Patient Location: PACU  Anesthesia Type:General  Level of Consciousness: sedated, patient cooperative and responds to stimulation  Airway & Oxygen Therapy: Patient Spontanous Breathing and Patient connected to face mask oxygen  Post-op Assessment: Report given to RN and Post -op Vital signs reviewed and stable  Post vital signs: Reviewed and stable  Last Vitals:  Filed Vitals:   05/22/15 0723  BP: 120/88  Pulse: 72  Temp: 36.7 C  Resp: 16    Complications: No apparent anesthesia complications

## 2015-05-24 LAB — URINE CULTURE

## 2015-05-25 NOTE — Op Note (Signed)
PATIENT:  Christina Sawyer  PRE-OPERATIVE DIAGNOSIS: Large left UPJ stone  POST-OPERATIVE DIAGNOSIS: Same  PROCEDURE: Cystoscopy, left ureteroscopy with holmium laser lithotripsy and extraction of multiple stone fragments, placement of double-J stent on left-24 cm x 6 [place] contour with string  SURGEON:  Bertram MillardStephen M. Erykah Lippert, M.D.  ANESTHESIA:  General  EBL:  Minimal  DRAINS: 24 cm x 6 French contour stent with string  LOCAL MEDICATIONS USED:  None  SPECIMEN:  Stone fragments, given the patient  INDICATION: Christina HartshornLinda Farruggia is a 55 year old female who presented in late January with an obstructing, infected 13 mm left UPJ stone. She was stented and treated adequately with antibiotics. She presents at this time for holmium laser lithotripsy using ureteroscopic guidance for the stone. The procedure, as well as risks and complications have been discussed with her. She understands these, and desires to proceed.  Description of procedure: The patient was properly identified and marked (if applicable) in the holding area. They were then  taken to the operating room and placed on the table in a supine position. General anesthesia was then administered. Once fully anesthetized the patient was moved to the dorsolithotomy position and the genitalia and perineum were sterilely prepped and draped in standard fashion. An official timeout was then performed.  A 23 French cystoscope was advanced into her bladder. The bladder was inspected circumferentially. There were no trabeculations, tumors or foreign bodies. Ureteral orifices were normal in configuration and location. The stent was grasped, protruding through the left ureteral orifice and removed.  I then placed a guidewire in the left ureteral orifice and guided it up into the left upper pole calyceal system with fluoroscopy. I then negotiated, first the inner core then the entire 12/14 ureteral access sheath, 38 cm in size, up the left ureter using fluoroscopic  guidance. The proximal tip was placed just at the UPJ. The inner core and the sensor-tip guidewire were then removed. The 6 French dual-lumen flexible ureteroscope was then advanced up the access sheath, and into the renal pelvis. The large stone could be seen. Pyelo-calyceal system was thoroughly inspected otherwise, no further stones were seen. The 200  laser fiber was advanced through the ureteroscope. Using the holmium laser energy at 15 Hz and 0.5 J, the stone was fragmented into multiple fragments that could then be extracted. Multiple tiny sand-like fragments were also produced. Leaving the holmium laser fiber is in place, a [place] basket was then advanced through the ureteroscope, and the fragments were then grasped and extracted. Several larger fragments had to be fragmented again with the laser. Using this technique, the stone was sequentially extracted in fragments through the access sheath. Thorough inspection of the pyelo-calyceal system after this revealed no significant larger fragments, just sand-like fragments which were felt to be small enough to easily pass either along the stent or through the ureter once the stent was removed. At this point, the ureteroscope was removed. The sensor-tip guidewire was then replaced up the ureteral access sheath, and the access sheath removed. Cystoscopically, after the guidewire was backloaded through the scope, a 24 cm x 6 French contour double-J stent was placed using fluoroscopic guidance. Once adequately position, the guidewire was removed. Excellent proximal curl was seen using fluoroscopy, distal curl within the bladder was seen cystoscopically. The bladder was then drained. The scope was removed. This drain was then trimmed, and taped to the patient's perineum. Stone fragments were saved, and eventually given to the patient.  The patient was then awakened and taken  to the PACU in stable condition.    PLAN OF CARE: Discharge to home after  PACU  PATIENT DISPOSITION:  PACU - hemodynamically stable.

## 2015-06-12 ENCOUNTER — Encounter (HOSPITAL_COMMUNITY): Payer: Self-pay | Admitting: Urology

## 2015-12-25 LAB — GLUCOSE, POCT (MANUAL RESULT ENTRY): POC Glucose: 106 mg/dl — AB (ref 70–99)

## 2016-02-04 ENCOUNTER — Ambulatory Visit (HOSPITAL_COMMUNITY)
Admission: EM | Admit: 2016-02-04 | Discharge: 2016-02-04 | Disposition: A | Discharge status: Home / Self Care | Payer: Non-veteran care | Attending: Emergency Medicine | Admitting: Emergency Medicine

## 2016-02-04 ENCOUNTER — Encounter (HOSPITAL_COMMUNITY): Payer: Self-pay | Admitting: Emergency Medicine

## 2016-02-04 DIAGNOSIS — R55 Syncope and collapse: Secondary | ICD-10-CM | POA: Diagnosis not present

## 2016-02-04 DIAGNOSIS — H60501 Unspecified acute noninfective otitis externa, right ear: Secondary | ICD-10-CM

## 2016-02-04 DIAGNOSIS — R05 Cough: Secondary | ICD-10-CM | POA: Diagnosis not present

## 2016-02-04 DIAGNOSIS — R059 Cough, unspecified: Secondary | ICD-10-CM

## 2016-02-04 LAB — POCT I-STAT, CHEM 8
BUN: 20 mg/dL (ref 6–20)
Calcium, Ion: 1.26 mmol/L (ref 1.15–1.40)
Chloride: 102 mmol/L (ref 101–111)
Creatinine, Ser: 0.9 mg/dL (ref 0.44–1.00)
Glucose, Bld: 104 mg/dL — ABNORMAL HIGH (ref 65–99)
HEMATOCRIT: 42 % (ref 36.0–46.0)
HEMOGLOBIN: 14.3 g/dL (ref 12.0–15.0)
POTASSIUM: 3.6 mmol/L (ref 3.5–5.1)
SODIUM: 140 mmol/L (ref 135–145)
TCO2: 29 mmol/L (ref 0–100)

## 2016-02-04 MED ORDER — NEOMYCIN-POLYMYXIN-HC 3.5-10000-1 OT SUSP: 4.0000 [drp] | Freq: Four times a day (QID) | OTIC | 0 refills | Status: DC

## 2016-02-04 NOTE — Discharge Instructions (Signed)
You have an infection of your right ear. Use the eardrops 4 times a day for 5 days. You lungs are nice and clear. Continue Mucinex and over-the-counter medicines to help with your cough and congestion. Your blood pressure is a little elevated, but not to a concerning degree. Your EKG and blood work are normal today. I do not want you to be on your own for the next 2 days in case you have recurrent episodes of passing out. Make sure you are drinking lots of fluids and getting rest. If you have another syncopal event, please go to the emergency room. Otherwise, please set up an appointment with your primary care doctor for your blood pressure and follow-up.

## 2016-02-04 NOTE — ED Provider Notes (Signed)
[facility]    CSN: [id number] Arrival date & time: 02/04/16  1922     History   Chief Complaint No chief complaint on file.   HPI Christina Sawyer is a 55 y.o. female.   HPI  She is a 55 year old woman here for evaluation of high blood pressure and syncopal episode. She has a history of hyperlipidemia. She states for the last week she has been fighting off a cold with ago, she worked at night when it was windy. Since then, she has had pain, primarily in her right ear. Yesterday, she noted that her blood pressure was elevated. After resting, this came down. 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 She states she was only out for a second or 2. She landed on her hip, which woke her up. She denies any head injury. There was no preceding dizziness, chest pain, palpitations, shortness of breath. She denies any chest pain, shortness of breath, dizziness, leg swelling, leg pain. She intermittently takes Premarin, but hasn't recently. No recent immobilization or long car rides.  She is working on her own the next 2 days.  Past Medical History:  Diagnosis Date  . Bladder incontinence    "interstim implant"- Right flank  . Complication of anesthesia    "Stadol caused over sedation"  . Hypercholesteremia   . Kidney stone    at present-has stent in place.  Marland Kitchen. PONV (postoperative nausea and vomiting)   . 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 Acute unilateral obstructive uropathy 04/14/2015  . Hydronephrosis of left kidney 04/14/2015  . Hyperkalemia 04/14/2015  . Leucocytosis 04/14/2015  . UTI (urinary tract infection) 04/14/2015  . Nausea 04/14/2015    Past Surgical History:  Procedure Laterality Date  . ANAL SPHINCTEROPLASTY N/A 12 2014  . APPENDECTOMY    . CHOLECYSTECTOMY    . CYSTOSCOPY W/ URETERAL STENT PLACEMENT Left 04/15/2015   Procedure: CYSTOSCOPY WITH  RETROGRADE PYELOGRAM/URETERAL STENT PLACEMENT;  Surgeon: Marcine MatarStephen Dahlstedt, MD;  Location: [facility];  Service: Urology;  Laterality: Left;  . CYSTOSCOPY WITH URETEROSCOPY AND STENT PLACEMENT Left 05/22/2015   Procedure: CYSTOSCOPY WITH URETEROSCOPY, STONE BASKETRY AND STENT EXCHANGE;  Surgeon: Marcine MatarStephen Dahlstedt, MD;  Location: [facility];  Service: Urology;  Laterality: Left;  . HIP SURGERY     right  . HOLMIUM LASER APPLICATION Left 05/22/2015   Procedure: HOLMIUM LASER APPLICATION;  Surgeon: Marcine MatarStephen Dahlstedt, MD;  Location: [facility];  Service: Urology;  Laterality: Left;  . TUBAL LIGATION      OB History    No data available       Home Medications    Prior to Admission medications   Medication Sig Start Date End Date Taking? Authorizing Provider  Chlorphen-Pseudoephed-[facility](THERAFLU FLU/COLD PO) Take by mouth.   Yes Historical Provider, MD  atorvastatin (LIPITOR) 40 MG tablet Take 40 mg by mouth at bedtime.    Historical Provider, MD  conjugated estrogens (PREMARIN) vaginal cream Place 1 Applicatorful vaginally every 3 (three) days.    Historical Provider, MD  docusate sodium (COLACE) 100 MG capsule Take 100 mg by mouth 2 (two) times daily.    Historical Provider, MD  ibuprofen (ADVIL,MOTRIN) 200 MG tablet Take 200 mg by mouth every 6 (six) hours as needed.    Historical Provider, MD  neomycin-polymyxin-hydrocortisone (CORTISPORIN) 3.5-10000-1 otic suspension Place 4 drops into the right ear 4 (  four) times daily. For 5 days 02/04/16   Charm Rings, MD  oxybutynin (DITROPAN) 5 MG tablet Take 5 mg by mouth 3 (three) times daily.    Historical Provider, MD  traMADol (ULTRAM) 50 MG tablet Take 1 tablet (50 mg total) by mouth every 12 (twelve) hours as needed. Patient not taking: Reported on 02/04/2016 05/22/15   Marcine Matar, MD  traMADol (ULTRAM) 50 MG tablet Take 1 tablet (50 mg total) by mouth every 12 (twelve) hours as needed for moderate pain. Patient not taking: Reported on 02/04/2016  05/22/15   Marcine Matar, MD    Family History No family history on file.  Social History Social History  Substance Use Topics  . Smoking status: Passive Smoke Exposure - Never Smoker  . Smokeless tobacco: Not on file  . Alcohol use 0.6 oz/week    1 Glasses of wine per week     Comment: wine occ     Allergies   Stadol [butorphanol]   Review of Systems Review of Systems As in history of present illness  Physical Exam Triage Vital Signs ED Triage Vitals  Enc Vitals Group     BP 02/04/16 1945 153/97     Pulse Rate 02/04/16 1945 89     Resp 02/04/16 1945 16     Temp 02/04/16 1945 98.6 F (37 C)     Temp Source 02/04/16 1945 Oral     SpO2 02/04/16 1945 98 %     Weight --      Height --      Head Circumference --      Peak Flow --      Pain Score 02/04/16 1949 4     Pain Loc --      Pain Edu? --      Excl. in GC? --    Orthostatic VS for the past 24 hrs:  BP- Lying Pulse- Lying BP- Sitting Pulse- Sitting BP- Standing at 0 minutes Pulse- Standing at 0 minutes  02/04/16 2015 (!) 151/95 82 (!) 161/93 84 (!) 148/91 94    Updated Vital Signs BP 153/97 (BP Location: Left Arm)   Pulse 89   Temp 98.6 F (37 C) (Oral)   Resp 16   SpO2 98%   Visual Acuity Right Eye Distance:   Left Eye Distance:   Bilateral Distance:    Right Eye Near:   Left Eye Near:    Bilateral Near:     Physical Exam  Constitutional: She is oriented to person, place, and time. She appears well-developed and well-nourished. No distress.  Appears anxious  HENT:  Nose: Nose normal.  Mouth/Throat: Oropharynx is clear and moist. No oropharyngeal exudate.  Left TM normal. Right TM is normal. Right ear canal is slightly swollen and tender.  Neck: Neck supple.  Cardiovascular: Normal rate, regular rhythm and normal heart sounds.   No murmur heard. Pulmonary/Chest: Effort normal and breath sounds normal. No respiratory distress. She has no wheezes.  Lymphadenopathy:    She has no cervical  adenopathy.  Neurological: She is alert and oriented to person, place, and time.     UC Treatments / Results  Labs (all labs ordered are listed, but only abnormal results are displayed) Labs Reviewed  POCT I-STAT, CHEM 8 - Abnormal; Notable for the following:       Result Value   Glucose, Bld 104 (*)    All other components within normal limits    EKG  EKG Interpretation None  Radiology No results found.  Procedures ED EKG Date/Time: 02/04/2016 8:12 PM Performed by: Charm RingsHONIG, ERIN J Authorized by: Charm RingsHONIG, ERIN J   ECG reviewed by ED Physician in the absence of a cardiologist: yes   Previous ECG:    Previous ECG:  Unavailable Interpretation:    Interpretation: non-specific   Rate:    ECG rate:  81   ECG rate assessment: normal   Rhythm:    Rhythm: sinus rhythm   Ectopy:    Ectopy: none   QRS:    QRS axis:  Normal   QRS intervals:  Normal Conduction:    Conduction: normal   ST segments:    ST segments:  Normal T waves:    T waves: normal   Other findings:    Other findings: poor R wave progression   Comments:     NSR, poor R-wave progression   (including critical care time)  Medications Ordered in UC Medications - No data to display   Initial Impression / Assessment and Plan / UC Course  I have reviewed the triage vital signs and the nursing notes.  Pertinent labs & imaging results that were available during my care of the patient were reviewed by me and considered in my medical decision making (see chart for details).  Clinical Course as of Feb 03 2034  Wed Feb 04, 2016  2009 ED EKG [EH]    Clinical Course User Index [EH] Charm RingsErin J Honig, MD    EKG and i-STAT are unremarkable. Orthostatics negative. Syncopal episode may be secondary to fatigue and upper respiratory infection. She does have otitis externa on the right. Will treat with Cortisporin. 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 I have provided a  work note stating that she requires supervision for the next 48 hours.  Emphasized to the patient that I am more than happy to speak with her supervisor if necessary. If she has recurrent syncopal episodes, she is to go straight to the emergency room. I've encouraged her to set up an appointment with her PCP to evaluate her blood pressure and further evaluate syncope.  Final Clinical Impressions(s) / UC Diagnoses   Final diagnoses:  Syncope, unspecified syncope type  Acute otitis externa of right ear, unspecified type  Cough    New Prescriptions New Prescriptions   NEOMYCIN-POLYMYXIN-HYDROCORTISONE (CORTISPORIN) 3.5-10000-1 OTIC SUSPENSION    Place 4 drops into the right ear 4 (four) times daily. For 5 days     Charm RingsErin J Honig, MD 02/04/16 2036

## 2016-02-04 NOTE — ED Triage Notes (Signed)
11:00 am passed out-patient reports standing and then waking up on the floor.  Patient reports right ear is painful.  Reports uri symptoms for a week: cough, runny nose.

## 2017-07-06 ENCOUNTER — Ambulatory Visit (INDEPENDENT_AMBULATORY_CARE_PROVIDER_SITE_OTHER): Payer: PRIVATE HEALTH INSURANCE | Admitting: Orthopaedic Surgery

## 2017-07-06 ENCOUNTER — Ambulatory Visit (INDEPENDENT_AMBULATORY_CARE_PROVIDER_SITE_OTHER): Payer: PRIVATE HEALTH INSURANCE

## 2017-07-06 VITALS — BP 126/78 | HR 72 | Ht 66.0 in | Wt 150.0 lb

## 2017-07-06 DIAGNOSIS — G8929 Other chronic pain: Secondary | ICD-10-CM

## 2017-07-06 DIAGNOSIS — M545 Low back pain, unspecified: Secondary | ICD-10-CM

## 2017-07-22 ENCOUNTER — Encounter (INDEPENDENT_AMBULATORY_CARE_PROVIDER_SITE_OTHER): Payer: Self-pay | Admitting: Orthopaedic Surgery

## 2017-07-22 NOTE — Progress Notes (Signed)
Office Visit Note   Patient: Christina Sawyer           Date of Birth: [birth year]           MRN: [id number] Visit Date: 07/06/2017              Requested by: Concha Pyo, MD [address] [place], [place] [address] PCP: Concha Pyo, MD   Assessment & Plan: Visit Diagnoses:  1. Chronic midline low back pain without sciatica     Plan: Patient is neurologically intact and x-rays are negative for acute changes.  She would do best to have a job where she could do some sitting some standing and avoid repetitive heavy lifting.  No additional imaging studies are needed at this time.  She should be able to work normal shifts.  Follow-Up Instructions: Return if symptoms worsen or fail to improve.   Orders:  Orders Placed This Encounter  Procedures  . XR Lumbar Spine 2-3 Views   No orders of the defined types were placed in this encounter.     Procedures: No procedures performed   Clinical Data: No additional findings.   Subjective: Chief Complaint  Patient presents with  . Lower Back - Pain    HPI 57 year old female referred by vocational rehabilitation with history of chronic back pain.  She states she has pain in the upper lumbar region no leg symptoms.  No associated bowel or bladder symptoms no fever or chills.  States she occasionally has spasms in her back.  She is used extra strength Tylenol and Aspercreme.  No history of back surgery.  Review of Systems positive for childbirth with anal tear at childbirth requiring repair.  Positive for kidney stones postoperative nausea and vomiting.  History of TB.  Hypercholesterolemia.  She has an implant for bladder incontinence problems.  Patient is a non-smoker.  Previous childbirth x3.  Gallbladder surgery.  Tubal ligation.  Kidney stone removal.  Positive for anxiety.   Objective: Vital Signs: BP 126/78 (BP Location: Left Arm, Patient Position: Sitting, Cuff Size: Large)   Pulse 72   Ht  (1.676 m)   Wt 150 lb (68 kg)    BMI 24.21 kg/m   Physical Exam  Constitutional: She is oriented to person, place, and time. She appears well-developed.  HENT:  Head: Normocephalic.  Right Ear: External ear normal.  Left Ear: External ear normal.  Eyes: Pupils are equal, round, and reactive to light.  Neck: No tracheal deviation present. No thyromegaly present.  Cardiovascular: Normal rate.  Pulmonary/Chest: Effort normal.  Abdominal: Soft.  Neurological: She is alert and oriented to person, place, and time.  Skin: Skin is warm and dry.  Psychiatric: She has a normal mood and affect. Her behavior is normal.    Ortho Exam patient has mild tenderness to palpation in the lumbar spine.  Reflexes are 2+ and symmetrical.  She is able to heel and toe walk.  Anterior tib EHL gastrocsoleus is strong there is no atrophy.  Normal hip range of motion knees reach full extension good quad strength.  Patient has intact sensation in the lower extremities.  Specialty Comments:  No specialty comments available.  Imaging: AP and lateral lumbar x-rays are obtained and reviewed.  Patient has minimal lumbar curvature.  Bladder stimulator battery and wires noted.  Schmorl's nodes noted at L1-L2 3 and L3-4.  No anterolisthesis.  Minimal disc space narrowing at L3-4 L4-5.  Impression: Lumbar spine x-rays negative for acute changes.  PMFS History: Patient Active Problem List   Diagnosis Date Noted  . Acute unilateral obstructive uropathy 04/14/2015  . Hydronephrosis of left kidney 04/14/2015  . Hyperkalemia 04/14/2015  . Leucocytosis 04/14/2015  . UTI (urinary tract infection) 04/14/2015  . Nausea 04/14/2015   Past Medical History:  Diagnosis Date  . Bladder incontinence    "interstim implant"- Right flank  . Complication of anesthesia    "Stadol caused over sedation"  . Hypercholesteremia   . Kidney stone    at present-has stent in place.  Marland Kitchen PONV (postoperative nausea and vomiting)   . 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  Past Surgical History:  Procedure Laterality Date  . ANAL SPHINCTEROPLASTY N/A 12 2014  . APPENDECTOMY    . CHOLECYSTECTOMY    . CYSTOSCOPY W/ URETERAL STENT PLACEMENT Left 04/15/2015   Procedure: CYSTOSCOPY WITH RETROGRADE PYELOGRAM/URETERAL STENT PLACEMENT;  Surgeon: Marcine Matar, MD;  Location: [facility];  Service: Urology;  Laterality: Left;  . CYSTOSCOPY WITH URETEROSCOPY AND STENT PLACEMENT Left 05/22/2015   Procedure: CYSTOSCOPY WITH URETEROSCOPY, STONE BASKETRY AND STENT EXCHANGE;  Surgeon: Marcine Matar, MD;  Location: [facility];  Service: Urology;  Laterality: Left;  . HIP SURGERY     right  . HOLMIUM LASER APPLICATION Left 05/22/2015   Procedure: HOLMIUM LASER APPLICATION;  Surgeon: Marcine Matar, MD;  Location: [facility];  Service: Urology;  Laterality: Left;  . TUBAL LIGATION     Social History   Occupational History  . Not on file  Tobacco Use  . Smoking status: Passive Smoke Exposure - Never Smoker  Substance and Sexual Activity  . Alcohol use: Yes    Alcohol/week: 0.6 oz    Types: 1 Glasses of wine per week    Comment: wine occ  . Drug use: No  . Sexual activity: Not Currently

## 2017-08-10 ENCOUNTER — Telehealth (INDEPENDENT_AMBULATORY_CARE_PROVIDER_SITE_OTHER): Payer: Self-pay | Admitting: Orthopaedic Surgery

## 2017-08-10 NOTE — Telephone Encounter (Signed)
Can you please fax these? Thanks.

## 2017-08-10 NOTE — Telephone Encounter (Signed)
faxed

## 2017-08-10 NOTE — Telephone Encounter (Signed)
Aram Beecham from [facility] called asking for the patients last office visits to be faxed over to [phone] to her attention. Thank you

## 2017-09-23 LAB — GLUCOSE, POCT (MANUAL RESULT ENTRY): POC Glucose: 103 mg/dl — AB (ref 70–99)

## 2017-12-25 DIAGNOSIS — R079 Chest pain, unspecified: Secondary | ICD-10-CM | POA: Insufficient documentation

## 2018-04-03 ENCOUNTER — Emergency Department (HOSPITAL_COMMUNITY)
Admission: EM | Admit: 2018-04-03 | Discharge: 2018-04-03 | Disposition: A | Discharge status: Home / Self Care | Payer: No Typology Code available for payment source | Attending: Emergency Medicine | Admitting: Emergency Medicine

## 2018-04-03 ENCOUNTER — Encounter (HOSPITAL_COMMUNITY): Payer: Self-pay

## 2018-04-03 ENCOUNTER — Other Ambulatory Visit: Payer: Self-pay

## 2018-04-03 ENCOUNTER — Emergency Department (HOSPITAL_COMMUNITY): Payer: No Typology Code available for payment source

## 2018-04-03 DIAGNOSIS — Z7722 Contact with and (suspected) exposure to environmental tobacco smoke (acute) (chronic): Secondary | ICD-10-CM | POA: Diagnosis not present

## 2018-04-03 DIAGNOSIS — Z79899 Other long term (current) drug therapy: Secondary | ICD-10-CM | POA: Diagnosis not present

## 2018-04-03 DIAGNOSIS — R7309 Other abnormal glucose: Secondary | ICD-10-CM | POA: Diagnosis not present

## 2018-04-03 DIAGNOSIS — N23 Unspecified renal colic: Secondary | ICD-10-CM | POA: Diagnosis not present

## 2018-04-03 DIAGNOSIS — R102 Pelvic and perineal pain: Secondary | ICD-10-CM | POA: Diagnosis present

## 2018-04-03 DIAGNOSIS — Z9049 Acquired absence of other specified parts of digestive tract: Secondary | ICD-10-CM | POA: Diagnosis not present

## 2018-04-03 LAB — COMPREHENSIVE METABOLIC PANEL
ALT: 15 U/L (ref 0–44)
ANION GAP: 8 (ref 5–15)
AST: 18 U/L (ref 15–41)
Albumin: 4.3 g/dL (ref 3.5–5.0)
Alkaline Phosphatase: 108 U/L (ref 38–126)
BUN: 23 mg/dL — ABNORMAL HIGH (ref 6–20)
CHLORIDE: 104 mmol/L (ref 98–111)
CO2: 28 mmol/L (ref 22–32)
CREATININE: 0.98 mg/dL (ref 0.44–1.00)
Calcium: 9.9 mg/dL (ref 8.9–10.3)
Glucose, Bld: 176 mg/dL — ABNORMAL HIGH (ref 70–99)
POTASSIUM: 4.4 mmol/L (ref 3.5–5.1)
SODIUM: 140 mmol/L (ref 135–145)
Total Bilirubin: 0.4 mg/dL (ref 0.3–1.2)
Total Protein: 7.2 g/dL (ref 6.5–8.1)

## 2018-04-03 LAB — CBC WITH DIFFERENTIAL/PLATELET
ABS IMMATURE GRANULOCYTES: 0.14 10*3/uL — AB (ref 0.00–0.07)
BASOS ABS: 0 10*3/uL (ref 0.0–0.1)
BASOS PCT: 0 %
Eosinophils Absolute: 0.1 10*3/uL (ref 0.0–0.5)
Eosinophils Relative: 1 %
HCT: 41.3 % (ref 36.0–46.0)
Hemoglobin: 13.1 g/dL (ref 12.0–15.0)
Immature Granulocytes: 2 %
Lymphocytes Relative: 9 %
Lymphs Abs: 0.8 10*3/uL (ref 0.7–4.0)
MCH: 28.9 pg (ref 26.0–34.0)
MCHC: 31.7 g/dL (ref 30.0–36.0)
MCV: 91.2 fL (ref 80.0–100.0)
Monocytes Absolute: 0.6 10*3/uL (ref 0.1–1.0)
Monocytes Relative: 6 %
NEUTROS ABS: 7.8 10*3/uL — AB (ref 1.7–7.7)
NRBC: 0 % (ref 0.0–0.2)
Neutrophils Relative %: 82 %
PLATELETS: 132 10*3/uL — AB (ref 150–400)
RBC: 4.53 MIL/uL (ref 3.87–5.11)
RDW: 12.2 % (ref 11.5–15.5)
WBC: 9.5 10*3/uL (ref 4.0–10.5)

## 2018-04-03 LAB — URINALYSIS, ROUTINE W REFLEX MICROSCOPIC
BILIRUBIN URINE: NEGATIVE
GLUCOSE, UA: NEGATIVE mg/dL
KETONES UR: NEGATIVE mg/dL
Nitrite: NEGATIVE
Protein, ur: NEGATIVE mg/dL
RBC / HPF: >50 RBC/hpf — ABNORMAL HIGH (ref 0–5)
Specific Gravity, Urine: 1.018 (ref 1.005–1.030)
pH: 6 (ref 5.0–8.0)

## 2018-04-03 LAB — LIPASE, BLOOD: LIPASE: 41 U/L (ref 11–51)

## 2018-04-03 MED ORDER — NITROFURANTOIN MONOHYD MACRO 100 MG PO CAPS
100.0000 mg | ORAL_CAPSULE | Freq: Once | ORAL | Status: AC
  Administered 2018-04-03: 100 mg via ORAL
  Filled 2018-04-03: qty 1

## 2018-04-03 MED ORDER — SODIUM CHLORIDE 0.9 % IV BOLUS
1000.0000 mL | Freq: Once | INTRAVENOUS | Status: AC
  Administered 2018-04-03: 1000 mL via INTRAVENOUS

## 2018-04-03 MED ORDER — METOCLOPRAMIDE HCL 5 MG/ML IJ SOLN: 10.0000 mg | Freq: Once | INTRAMUSCULAR | Status: DC

## 2018-04-03 MED ORDER — ONDANSETRON HCL 4 MG/2ML IJ SOLN
4.0000 mg | Freq: Once | INTRAMUSCULAR | Status: AC
  Administered 2018-04-03: 4 mg via INTRAVENOUS
  Filled 2018-04-03: qty 2

## 2018-04-03 MED ORDER — KETOROLAC TROMETHAMINE 30 MG/ML IJ SOLN
30.0000 mg | Freq: Once | INTRAMUSCULAR | Status: AC
  Administered 2018-04-03: 30 mg via INTRAVENOUS
  Filled 2018-04-03: qty 1

## 2018-04-03 MED ORDER — NITROFURANTOIN MONOHYD MACRO 100 MG PO CAPS: 100.0000 mg | ORAL_CAPSULE | Freq: Two times a day (BID) | ORAL | 0 refills | Status: DC

## 2018-04-03 MED ORDER — MORPHINE SULFATE (PF) 4 MG/ML IV SOLN
4.0000 mg | Freq: Once | INTRAVENOUS | Status: AC
  Administered 2018-04-03: 4 mg via INTRAVENOUS
  Filled 2018-04-03: qty 1

## 2018-04-03 MED ORDER — METOCLOPRAMIDE HCL 10 MG PO TABS
10.0000 mg | ORAL_TABLET | Freq: Once | ORAL | Status: AC
  Administered 2018-04-03: 10 mg via ORAL
  Filled 2018-04-03: qty 1

## 2018-04-03 NOTE — ED Provider Notes (Signed)
[facility]-EMERGENCY DEPT Provider Note   CSN: [id number] Arrival date & time: 04/03/18  0032     History   Chief Complaint Chief Complaint  Patient presents with  . Flank Pain    HPI Christina HartshornLinda Sawyer is a 58 y.o. female.  The history is provided by the patient.  Flank Pain   She has history of hyperlipidemia, kidney stones and comes in with onset this evening of vaginal pain with associated urinary urgency and hesitancy.  She rates pain at 6/10.  She took a dose of ibuprofen, but vomited following that.  She denies any abdominal pain or flank pain.  She has had chills but no fever or sweats.  He has a history of having had ureteral stents, and states this feels the same way.  Past Medical History:  Diagnosis Date  . Bladder incontinence    "interstim implant"- Right flank  . Complication of anesthesia    "Stadol caused over sedation"  . Hypercholesteremia   . Kidney stone    at present-has stent in place.  Marland Kitchen. PONV (postoperative nausea and vomiting)   . 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 Acute unilateral obstructive uropathy 04/14/2015  . Hydronephrosis of left kidney 04/14/2015  . Hyperkalemia 04/14/2015  . Leucocytosis 04/14/2015  . UTI (urinary tract infection) 04/14/2015  . Nausea 04/14/2015    Past Surgical History:  Procedure Laterality Date  . ANAL SPHINCTEROPLASTY N/A 12 2014  . APPENDECTOMY    . CHOLECYSTECTOMY    . CYSTOSCOPY W/ URETERAL STENT PLACEMENT Left 04/15/2015   Procedure: CYSTOSCOPY WITH RETROGRADE PYELOGRAM/URETERAL STENT PLACEMENT;  Surgeon: Marcine MatarStephen Dahlstedt, MD;  Location: [facility];  Service: Urology;  Laterality: Left;  . CYSTOSCOPY WITH URETEROSCOPY AND STENT PLACEMENT Left 05/22/2015   Procedure: CYSTOSCOPY WITH URETEROSCOPY, STONE BASKETRY AND STENT EXCHANGE;  Surgeon: Marcine MatarStephen Dahlstedt, MD;  Location: [facility];  Service: Urology;  Laterality:  Left;  . HIP SURGERY     right  . HOLMIUM LASER APPLICATION Left 05/22/2015   Procedure: HOLMIUM LASER APPLICATION;  Surgeon: Marcine MatarStephen Dahlstedt, MD;  Location: [facility];  Service: Urology;  Laterality: Left;  . TUBAL LIGATION       OB History   No obstetric history on file.      Home Medications    Prior to Admission medications   Medication Sig Start Date End Date Taking? Authorizing Provider  atorvastatin (LIPITOR) 40 MG tablet Take 40 mg by mouth at bedtime.    [provider]  Chlorphen-Pseudoephed-APAP (THERAFLU FLU/COLD PO) Take by mouth.    [provider]  conjugated estrogens (PREMARIN) vaginal cream Place 1 Applicatorful vaginally every 3 (three) days.    [provider]  docusate sodium (COLACE) 100 MG capsule Take 100 mg by mouth 2 (two) times daily.    [provider]  ibuprofen (ADVIL,MOTRIN) 200 MG tablet Take 200 mg by mouth every 6 (six) hours as needed.    [provider]  neomycin-polymyxin-hydrocortisone (CORTISPORIN) 3.5-10000-1 otic suspension Place 4 drops into the right ear 4 (four) times daily. For 5 days 02/04/16   Charm RingsHonig, Erin J, MD  oxybutynin (DITROPAN) 5 MG tablet Take 5 mg by mouth 3 (three) times daily.    [provider]  traMADol (ULTRAM) 50 MG tablet Take 1 tablet (50 mg total) by mouth every 12 (twelve) hours as needed. Patient not taking: Reported on 02/04/2016 05/22/15  Marcine Matar, MD  traMADol (ULTRAM) 50 MG tablet Take 1 tablet (50 mg total) by mouth every 12 (twelve) hours as needed for moderate pain. Patient not taking: Reported on 02/04/2016 05/22/15   Marcine Matar, MD    Family History History reviewed. No pertinent family history.  Social History Social History   Tobacco Use  . Smoking status: Passive Smoke Exposure - Never Smoker  . Smokeless tobacco: Never Used  Substance Use Topics  . Alcohol use: Yes    Alcohol/week: 1.0 standard drinks    Types: 1 Glasses of wine  per week    Comment: wine occ  . Drug use: No     Allergies   Stadol [butorphanol]   Review of Systems Review of Systems  Genitourinary: Positive for flank pain.  All other systems reviewed and are negative.    Physical Exam Updated Vital Signs BP (!) 172/83 (BP Location: Left Arm)   Pulse 74   Temp 98.6 F (37 C) (Oral)   Resp 18   Ht 5\' 6"  (1.676 m)   Wt 70.3 kg   SpO2 97%   BMI 25.02 kg/m   Physical Exam Vitals signs and nursing note reviewed.    58 year old female, resting comfortably and in no acute distress. Vital signs are significant for elevated systolic blood pressure. Oxygen saturation is 97%, which is normal. Head is normocephalic and atraumatic. PERRLA, EOMI. Oropharynx is clear. Neck is nontender and supple without adenopathy or JVD. Back is nontender in the midline.  There is mild right CVA tenderness. Lungs are clear without rales, wheezes, or rhonchi. Chest is nontender. Heart has regular rate and rhythm without murmur. Abdomen is soft, flat, with mild right lower quadrant tenderness.  There is no rebound or guarding.  There are no masses or hepatosplenomegaly and peristalsis is hypoactive. Extremities have no cyanosis or edema, full range of motion is present. Skin is warm and dry without rash. Neurologic: Mental status is normal, cranial nerves are intact, there are no motor or sensory deficits.  ED Treatments / Results  Labs (all labs ordered are listed, but only abnormal results are displayed) Labs Reviewed  URINALYSIS, ROUTINE W REFLEX MICROSCOPIC - Abnormal; Notable for the following components:      Result Value   Hgb urine dipstick MODERATE (*)    Leukocytes, UA SMALL (*)    RBC / HPF >50 (*)    WBC, UA >50 (*)    Bacteria, UA FEW (*)    All other components within normal limits  COMPREHENSIVE METABOLIC PANEL - Abnormal; Notable for the following components:   Glucose, Bld 176 (*)    BUN 23 (*)    All other components within normal  limits  CBC WITH DIFFERENTIAL/PLATELET - Abnormal; Notable for the following components:   Platelets 132 (*)    Neutro Abs 7.8 (*)    Abs Immature Granulocytes 0.14 (*)    All other components within normal limits  URINE CULTURE  LIPASE, BLOOD   Radiology Ct Renal Stone Study  Result Date: 04/03/2018 CLINICAL DATA:  58 year old female with abdominal pain. EXAM: CT ABDOMEN AND PELVIS WITHOUT CONTRAST TECHNIQUE: Multidetector CT imaging of the abdomen and pelvis was performed following the standard protocol without IV contrast. COMPARISON:  CT of the abdomen pelvis dated 04/14/2015 FINDINGS: Evaluation of this exam is limited in the absence of intravenous contrast. Lower chest: The visualized lung bases are clear. No intra-abdominal free air or free fluid. Hepatobiliary: The liver is unremarkable. No  intrahepatic biliary ductal dilatation. Cholecystectomy. Pancreas: Unremarkable. No pancreatic ductal dilatation or surrounding inflammatory changes. Spleen: Normal in size without focal abnormality. Adrenals/Urinary Tract: The adrenal glands are unremarkable. There are multiple nonobstructing bilateral renal calculi. The largest stone measures approximately 5 mm in the inferior pole of the left kidney. There is minimal fullness of the left renal collecting system. No hydronephrosis on either side. There is a 5 mm stone along the posterior wall of the urinary bladder adjacent to the right ureterovesical junction most consistent with a recently passed stone. The urinary bladder is otherwise grossly unremarkable. Stomach/Bowel: There is a small hiatal hernia. There is no bowel obstruction or active inflammation. Appendectomy. Vascular/Lymphatic: Mild atherosclerotic calcification of the aorta. The abdominal aorta and IVC are otherwise grossly unremarkable on this noncontrast CT. No portal venous gas. There is no adenopathy. Reproductive: The uterus is retroflexed. The ovaries are grossly unremarkable. No pelvic  mass. Other: None Musculoskeletal: Degenerative changes of the spine with multilevel Schmorl's node. There is disc desiccation and vacuum phenomena at L4-L5. A stimulator device is noted in the right gluteal subcutaneous fat with wire extending to the left posterior pelvis. No acute osseous pathology. IMPRESSION: 1. A 5 mm recently passed stone within the urinary bladder. Multiple bilateral nonobstructing renal calculi. No hydronephrosis on either side. 2. No bowel obstruction or active inflammation. Electronically Signed   By: Elgie Collard M.D.   On: 04/03/2018 02:48    Procedures Procedures  Medications Ordered in ED Medications - No data to display   Initial Impression / Assessment and Plan / ED Course  I have reviewed the triage vital signs and the nursing notes.  Pertinent labs & imaging results that were available during my care of the patient were reviewed by me and considered in my medical decision making (see chart for details).  Vaginal pain with findings of right flank tenderness and right lower quadrant tenderness on exam.  Review of old records shows she had surgical management of left kidney stones in March 2017.  Review of CT scan at that time showed 2 calculi on the right side.  I suspect that her current pain is related to those stones.  Urinalysis is pending and will check screening labs and renal stone protocol CT scan.  Urine does have hematuria, but also WBCs.  This is likely secondary to kidney stone, but cannot be 100% sure there is no infection.  CT scan does show a 5 mm calculus that has made his way into the bladder.  Patient is advised of this finding.  Urine is sent for culture and she is discharged with prescription for nitrofurantoin.  Return precautions discussed.  Final Clinical Impressions(s) / ED Diagnoses   Final diagnoses:  Ureteral colic  Elevated glucose level    ED Discharge Orders         Ordered    nitrofurantoin, macrocrystal-monohydrate,  (MACROBID) 100 MG capsule  2 times daily     04/03/18 0341           Dione Booze, MD 04/03/18 [phone]

## 2018-04-03 NOTE — ED Notes (Signed)
Pt requesting nausea medication prior to discharge.

## 2018-04-03 NOTE — Discharge Instructions (Addendum)
Drink plenty of fluids.  Return if you start running a feer, or if pain is not being adequately controlled at home.

## 2018-04-03 NOTE — ED Notes (Signed)
Pt requesting assistance to the restroom. 

## 2018-04-03 NOTE — ED Triage Notes (Signed)
States just got off work and now pain in her vaginal area with hx of kidney stones states pain come on sudden and vomiting.

## 2018-04-05 LAB — URINE CULTURE: Culture: >=100000 — AB

## 2018-04-06 ENCOUNTER — Telehealth: Payer: Self-pay

## 2018-04-06 NOTE — Telephone Encounter (Signed)
Post ED Visit - Positive Culture Follow-up  Culture report reviewed by antimicrobial stewardship pharmacist:  []  Enzo Bi, Pharm.D. []  Celedonio Miyamoto, Pharm.D., BCPS AQ-ID []  Garvin Fila, Pharm.D., BCPS []  Georgina Pillion, Pharm.D., BCPS []  [place], [place].D., BCPS, AAHIVP []  Estella Husk, Pharm.D., BCPS, AAHIVP [x]  Lysle Pearl, PharmD, BCPS []  Phillips Climes, PharmD, BCPS []  Agapito Games, PharmD, BCPS []  Verlan Friends, PharmD  Positive urine culture Treated with Macrobid, organism sensitive to the same and no further patient follow-up is required at this time.  Jerry Caras 04/06/2018, 9:29 AM

## 2018-08-25 DIAGNOSIS — I498 Other specified cardiac arrhythmias: Secondary | ICD-10-CM | POA: Insufficient documentation

## 2019-08-14 ENCOUNTER — Ambulatory Visit: Payer: Non-veteran care | Attending: Internal Medicine

## 2019-08-14 DIAGNOSIS — Z23 Encounter for immunization: Secondary | ICD-10-CM

## 2019-08-14 NOTE — Progress Notes (Signed)
   Covid-19 Vaccination Clinic  Name:  Christina Sawyer    MRN: [id number] DOB: 09-24-1960  08/14/2019  [name] was observed post Covid-19 immunization for 15 minutes without incident. She was provided with Vaccine Information Sheet and instruction to access the V-Safe system.   [name] was instructed to call 911 with any severe reactions post vaccine: Marland Kitchen Difficulty breathing  . Swelling of face and throat  . A fast heartbeat  . A bad rash all over body  . Dizziness and weakness   Immunizations Administered    Name Date Dose VIS Date Route   Moderna COVID-19 Vaccine 08/14/2019  5:29 PM 0.5 mL 02/2019 Intramuscular   Manufacturer: Moderna   Lot: [id number]   NDC: 68032-122-48

## 2019-10-09 DIAGNOSIS — N12 Tubulo-interstitial nephritis, not specified as acute or chronic: Secondary | ICD-10-CM | POA: Diagnosis present

## 2019-10-09 DIAGNOSIS — N201 Calculus of ureter: Secondary | ICD-10-CM | POA: Insufficient documentation

## 2019-10-09 DIAGNOSIS — R7881 Bacteremia: Secondary | ICD-10-CM | POA: Insufficient documentation

## 2019-12-28 ENCOUNTER — Other Ambulatory Visit: Payer: Self-pay

## 2019-12-28 ENCOUNTER — Emergency Department: Payer: No Typology Code available for payment source

## 2019-12-28 DIAGNOSIS — E7800 Pure hypercholesterolemia, unspecified: Secondary | ICD-10-CM | POA: Diagnosis present

## 2019-12-28 DIAGNOSIS — R7301 Impaired fasting glucose: Secondary | ICD-10-CM | POA: Diagnosis present

## 2019-12-28 DIAGNOSIS — N201 Calculus of ureter: Secondary | ICD-10-CM | POA: Diagnosis not present

## 2019-12-28 DIAGNOSIS — Z9049 Acquired absence of other specified parts of digestive tract: Secondary | ICD-10-CM

## 2019-12-28 DIAGNOSIS — N136 Pyonephrosis: Principal | ICD-10-CM | POA: Diagnosis present

## 2019-12-28 DIAGNOSIS — Z888 Allergy status to other drugs, medicaments and biological substances status: Secondary | ICD-10-CM

## 2019-12-28 DIAGNOSIS — E785 Hyperlipidemia, unspecified: Secondary | ICD-10-CM | POA: Diagnosis present

## 2019-12-28 DIAGNOSIS — Z8615 Personal history of latent tuberculosis infection: Secondary | ICD-10-CM

## 2019-12-28 DIAGNOSIS — N179 Acute kidney failure, unspecified: Secondary | ICD-10-CM | POA: Diagnosis present

## 2019-12-28 DIAGNOSIS — Z20822 Contact with and (suspected) exposure to covid-19: Secondary | ICD-10-CM | POA: Diagnosis present

## 2019-12-28 DIAGNOSIS — B9620 Unspecified Escherichia coli [E. coli] as the cause of diseases classified elsewhere: Secondary | ICD-10-CM | POA: Diagnosis present

## 2019-12-28 DIAGNOSIS — Z7722 Contact with and (suspected) exposure to environmental tobacco smoke (acute) (chronic): Secondary | ICD-10-CM | POA: Diagnosis present

## 2019-12-28 DIAGNOSIS — Z79899 Other long term (current) drug therapy: Secondary | ICD-10-CM

## 2019-12-28 DIAGNOSIS — Z87442 Personal history of urinary calculi: Secondary | ICD-10-CM

## 2019-12-28 LAB — COMPREHENSIVE METABOLIC PANEL
ALT: 24 U/L (ref 0–44)
AST: 21 U/L (ref 15–41)
Albumin: 4.4 g/dL (ref 3.5–5.0)
Alkaline Phosphatase: 118 U/L (ref 38–126)
Anion gap: 8 (ref 5–15)
BUN: 22 mg/dL — ABNORMAL HIGH (ref 6–20)
CO2: 28 mmol/L (ref 22–32)
Calcium: 10.1 mg/dL (ref 8.9–10.3)
Chloride: 104 mmol/L (ref 98–111)
Creatinine, Ser: 1.18 mg/dL — ABNORMAL HIGH (ref 0.44–1.00)
GFR, Estimated: 51 mL/min — ABNORMAL LOW (ref >60)
Glucose, Bld: 198 mg/dL — ABNORMAL HIGH (ref 70–99)
Potassium: 4.4 mmol/L (ref 3.5–5.1)
Sodium: 140 mmol/L (ref 135–145)
Total Bilirubin: 1 mg/dL (ref 0.3–1.2)
Total Protein: 7.9 g/dL (ref 6.5–8.1)

## 2019-12-28 LAB — CBC
HCT: 42.7 % (ref 36.0–46.0)
Hemoglobin: 14.5 g/dL (ref 12.0–15.0)
MCH: 29.4 pg (ref 26.0–34.0)
MCHC: 34 g/dL (ref 30.0–36.0)
MCV: 86.4 fL (ref 80.0–100.0)
Platelets: 151 10*3/uL (ref 150–400)
RBC: 4.94 MIL/uL (ref 3.87–5.11)
RDW: 12.4 % (ref 11.5–15.5)
WBC: 11.7 10*3/uL — ABNORMAL HIGH (ref 4.0–10.5)
nRBC: 0 % (ref 0.0–0.2)

## 2019-12-28 MED ORDER — IBUPROFEN 400 MG PO TABS
400.0000 mg | ORAL_TABLET | Freq: Once | ORAL | Status: AC | PRN
  Administered 2019-12-28: 400 mg via ORAL
  Filled 2019-12-28: qty 1

## 2019-12-28 MED ORDER — ONDANSETRON 4 MG PO TBDP
4.0000 mg | ORAL_TABLET | Freq: Once | ORAL | Status: AC | PRN
  Administered 2019-12-28: 4 mg via ORAL
  Filled 2019-12-28: qty 1

## 2019-12-28 MED ORDER — FENTANYL CITRATE (PF) 100 MCG/2ML IJ SOLN
50.0000 ug | INTRAMUSCULAR | Status: AC | PRN
  Administered 2019-12-28 – 2019-12-29 (×2): 50 ug via NASAL
  Filled 2019-12-28 (×3): qty 2

## 2019-12-28 NOTE — ED Notes (Signed)
Pt declines offer for narcotic in triage.

## 2019-12-28 NOTE — ED Notes (Signed)
Patient given a warm blanket at this time and given an update on wait time.

## 2019-12-28 NOTE — ED Triage Notes (Signed)
Pt with onset of left sided flank pain an hour ago. Pt with history of renal calculi, states feels like a kidney stone. Pt with nausea.

## 2019-12-29 ENCOUNTER — Other Ambulatory Visit: Payer: Self-pay

## 2019-12-29 ENCOUNTER — Inpatient Hospital Stay: Payer: No Typology Code available for payment source | Admitting: Certified Registered"

## 2019-12-29 ENCOUNTER — Encounter: Payer: Self-pay | Admitting: Internal Medicine

## 2019-12-29 ENCOUNTER — Encounter
Admission: EM | Disposition: A | Discharge status: Home / Self Care | Payer: Self-pay | Source: Home / Self Care | Attending: Internal Medicine

## 2019-12-29 ENCOUNTER — Other Ambulatory Visit
Admission: RE | Admit: 2019-12-29 | Discharge: 2019-12-29 | Disposition: A | Discharge status: Home / Self Care | Payer: No Typology Code available for payment source | Source: Ambulatory Visit | Attending: Urology | Admitting: Urology

## 2019-12-29 ENCOUNTER — Inpatient Hospital Stay
Admission: EM | Admit: 2019-12-29 | Discharge: 2019-12-31 | DRG: 661 | Disposition: A | Discharge status: Home / Self Care | Payer: No Typology Code available for payment source | Attending: Internal Medicine | Admitting: Internal Medicine

## 2019-12-29 ENCOUNTER — Emergency Department: Payer: No Typology Code available for payment source

## 2019-12-29 DIAGNOSIS — N10 Acute tubulo-interstitial nephritis: Secondary | ICD-10-CM | POA: Diagnosis present

## 2019-12-29 DIAGNOSIS — N1330 Unspecified hydronephrosis: Secondary | ICD-10-CM | POA: Diagnosis present

## 2019-12-29 DIAGNOSIS — Z7722 Contact with and (suspected) exposure to environmental tobacco smoke (acute) (chronic): Secondary | ICD-10-CM | POA: Diagnosis present

## 2019-12-29 DIAGNOSIS — E7800 Pure hypercholesterolemia, unspecified: Secondary | ICD-10-CM | POA: Diagnosis present

## 2019-12-29 DIAGNOSIS — N201 Calculus of ureter: Secondary | ICD-10-CM

## 2019-12-29 DIAGNOSIS — Z888 Allergy status to other drugs, medicaments and biological substances status: Secondary | ICD-10-CM | POA: Diagnosis not present

## 2019-12-29 DIAGNOSIS — Z79899 Other long term (current) drug therapy: Secondary | ICD-10-CM | POA: Diagnosis not present

## 2019-12-29 DIAGNOSIS — E785 Hyperlipidemia, unspecified: Secondary | ICD-10-CM

## 2019-12-29 DIAGNOSIS — N12 Tubulo-interstitial nephritis, not specified as acute or chronic: Secondary | ICD-10-CM | POA: Diagnosis present

## 2019-12-29 DIAGNOSIS — N200 Calculus of kidney: Secondary | ICD-10-CM | POA: Diagnosis not present

## 2019-12-29 DIAGNOSIS — Z8615 Personal history of latent tuberculosis infection: Secondary | ICD-10-CM | POA: Diagnosis not present

## 2019-12-29 DIAGNOSIS — N139 Obstructive and reflux uropathy, unspecified: Secondary | ICD-10-CM | POA: Diagnosis not present

## 2019-12-29 DIAGNOSIS — R1032 Left lower quadrant pain: Secondary | ICD-10-CM | POA: Diagnosis not present

## 2019-12-29 DIAGNOSIS — N179 Acute kidney failure, unspecified: Secondary | ICD-10-CM | POA: Diagnosis not present

## 2019-12-29 DIAGNOSIS — N3090 Cystitis, unspecified without hematuria: Secondary | ICD-10-CM

## 2019-12-29 DIAGNOSIS — R7301 Impaired fasting glucose: Secondary | ICD-10-CM | POA: Diagnosis present

## 2019-12-29 DIAGNOSIS — Z20822 Contact with and (suspected) exposure to covid-19: Secondary | ICD-10-CM | POA: Diagnosis present

## 2019-12-29 DIAGNOSIS — N390 Urinary tract infection, site not specified: Secondary | ICD-10-CM

## 2019-12-29 DIAGNOSIS — Z87442 Personal history of urinary calculi: Secondary | ICD-10-CM | POA: Diagnosis not present

## 2019-12-29 DIAGNOSIS — N135 Crossing vessel and stricture of ureter without hydronephrosis: Secondary | ICD-10-CM

## 2019-12-29 DIAGNOSIS — B9620 Unspecified Escherichia coli [E. coli] as the cause of diseases classified elsewhere: Secondary | ICD-10-CM | POA: Diagnosis present

## 2019-12-29 DIAGNOSIS — N136 Pyonephrosis: Secondary | ICD-10-CM | POA: Diagnosis present

## 2019-12-29 DIAGNOSIS — N132 Hydronephrosis with renal and ureteral calculous obstruction: Secondary | ICD-10-CM

## 2019-12-29 DIAGNOSIS — Z9049 Acquired absence of other specified parts of digestive tract: Secondary | ICD-10-CM | POA: Diagnosis not present

## 2019-12-29 HISTORY — PX: CYSTOSCOPY WITH STENT PLACEMENT: SHX5790

## 2019-12-29 LAB — URINALYSIS, COMPLETE (UACMP) WITH MICROSCOPIC
Bilirubin Urine: NEGATIVE
Glucose, UA: NEGATIVE mg/dL
Ketones, ur: NEGATIVE mg/dL
Nitrite: POSITIVE — AB
Protein, ur: 30 mg/dL — AB
Specific Gravity, Urine: 1.021 (ref 1.005–1.030)
WBC, UA: >50 WBC/hpf — ABNORMAL HIGH (ref 0–5)
pH: 6 (ref 5.0–8.0)

## 2019-12-29 LAB — RESPIRATORY PANEL BY RT PCR (FLU A&B, COVID)
Influenza A by PCR: NEGATIVE
Influenza B by PCR: NEGATIVE
SARS Coronavirus 2 by RT PCR: NEGATIVE

## 2019-12-29 SURGERY — CYSTOSCOPY, WITH STENT INSERTION
Anesthesia: General | Site: Ureter | Laterality: Left

## 2019-12-29 MED ORDER — HYDROMORPHONE HCL 1 MG/ML IJ SOLN: 0.5000 mg | INTRAMUSCULAR | Status: DC | PRN

## 2019-12-29 MED ORDER — ONDANSETRON HCL 4 MG/2ML IJ SOLN
INTRAMUSCULAR | Status: DC | PRN
  Administered 2019-12-29: 4 mg via INTRAVENOUS

## 2019-12-29 MED ORDER — SUCCINYLCHOLINE CHLORIDE 20 MG/ML IJ SOLN
INTRAMUSCULAR | Status: DC | PRN
  Administered 2019-12-29: 100 mg via INTRAVENOUS

## 2019-12-29 MED ORDER — DEXAMETHASONE SODIUM PHOSPHATE 10 MG/ML IJ SOLN
INTRAMUSCULAR | Status: AC
  Filled 2019-12-29: qty 1

## 2019-12-29 MED ORDER — BELLADONNA ALKALOIDS-OPIUM 16.2-60 MG RE SUPP
RECTAL | Status: AC
  Filled 2019-12-29: qty 1

## 2019-12-29 MED ORDER — ACETAMINOPHEN 325 MG PO TABS
650.0000 mg | ORAL_TABLET | Freq: Four times a day (QID) | ORAL | Status: DC | PRN
  Administered 2019-12-30: 650 mg via ORAL
  Filled 2019-12-29: qty 2

## 2019-12-29 MED ORDER — PROPOFOL 10 MG/ML IV BOLUS
INTRAVENOUS | Status: AC
  Filled 2019-12-29: qty 20

## 2019-12-29 MED ORDER — FENTANYL CITRATE (PF) 100 MCG/2ML IJ SOLN
INTRAMUSCULAR | Status: DC | PRN
  Administered 2019-12-29: 50 ug via INTRAVENOUS

## 2019-12-29 MED ORDER — SODIUM CHLORIDE 0.9 % IV BOLUS
1000.0000 mL | Freq: Once | INTRAVENOUS | Status: AC
  Administered 2019-12-29: 1000 mL via INTRAVENOUS

## 2019-12-29 MED ORDER — ONDANSETRON HCL 4 MG/2ML IJ SOLN
INTRAMUSCULAR | Status: AC
  Filled 2019-12-29: qty 2

## 2019-12-29 MED ORDER — SODIUM CHLORIDE 0.9 % IV SOLN
1.0000 g | INTRAVENOUS | Status: DC
  Administered 2019-12-29 – 2019-12-30 (×2): 1 g via INTRAVENOUS
  Filled 2019-12-29: qty 1
  Filled 2019-12-29 (×2): qty 10

## 2019-12-29 MED ORDER — PROPOFOL 10 MG/ML IV BOLUS
INTRAVENOUS | Status: DC | PRN
  Administered 2019-12-29: 100 mg via INTRAVENOUS
  Administered 2019-12-29: 50 mg via INTRAVENOUS

## 2019-12-29 MED ORDER — HYDROMORPHONE HCL 1 MG/ML IJ SOLN
1.0000 mg | INTRAMUSCULAR | Status: DC | PRN
  Administered 2019-12-30: 1 mg via INTRAVENOUS
  Filled 2019-12-29: qty 1

## 2019-12-29 MED ORDER — SODIUM CHLORIDE 0.9 % IV SOLN
2.0000 g | Freq: Once | INTRAVENOUS | Status: AC
  Administered 2019-12-29: 2 g via INTRAVENOUS
  Filled 2019-12-29: qty 20

## 2019-12-29 MED ORDER — DEXAMETHASONE SODIUM PHOSPHATE 10 MG/ML IJ SOLN
INTRAMUSCULAR | Status: DC | PRN
  Administered 2019-12-29: 10 mg via INTRAVENOUS

## 2019-12-29 MED ORDER — LIDOCAINE HCL (PF) 2 % IJ SOLN
INTRAMUSCULAR | Status: AC
  Filled 2019-12-29: qty 5

## 2019-12-29 MED ORDER — PHENYLEPHRINE HCL (PRESSORS) 10 MG/ML IV SOLN
INTRAVENOUS | Status: AC
  Filled 2019-12-29: qty 1

## 2019-12-29 MED ORDER — FENTANYL CITRATE (PF) 100 MCG/2ML IJ SOLN
50.0000 ug | Freq: Once | INTRAMUSCULAR | Status: AC
  Administered 2019-12-29: 50 ug via INTRAVENOUS

## 2019-12-29 MED ORDER — KETOROLAC TROMETHAMINE 30 MG/ML IJ SOLN
15.0000 mg | INTRAMUSCULAR | Status: AC
  Administered 2019-12-29: 15 mg via INTRAVENOUS
  Filled 2019-12-29: qty 1

## 2019-12-29 MED ORDER — DARIFENACIN HYDROBROMIDE ER 7.5 MG PO TB24
7.5000 mg | ORAL_TABLET | Freq: Every day | ORAL | Status: DC
  Administered 2019-12-30 – 2019-12-31 (×2): 7.5 mg via ORAL
  Filled 2019-12-29 (×3): qty 1

## 2019-12-29 MED ORDER — LACTATED RINGERS IV SOLN: INTRAVENOUS | Status: DC | PRN

## 2019-12-29 MED ORDER — FENTANYL CITRATE (PF) 100 MCG/2ML IJ SOLN: 25.0000 ug | INTRAMUSCULAR | Status: DC | PRN

## 2019-12-29 MED ORDER — PROMETHAZINE HCL 25 MG/ML IJ SOLN: 6.2500 mg | INTRAMUSCULAR | Status: DC | PRN

## 2019-12-29 MED ORDER — SODIUM CHLORIDE 0.9 % IV SOLN: Freq: Once | INTRAVENOUS | Status: AC

## 2019-12-29 MED ORDER — ACETAMINOPHEN 650 MG RE SUPP: 650.0000 mg | Freq: Four times a day (QID) | RECTAL | Status: DC | PRN

## 2019-12-29 MED ORDER — IOHEXOL 180 MG/ML  SOLN
INTRAMUSCULAR | Status: DC | PRN
  Administered 2019-12-29: 5 mL

## 2019-12-29 MED ORDER — HYDROMORPHONE HCL 1 MG/ML IJ SOLN
INTRAMUSCULAR | Status: AC
  Administered 2019-12-29: 0.5 mg via INTRAVENOUS
  Filled 2019-12-29: qty 1

## 2019-12-29 MED ORDER — OXYCODONE HCL 5 MG PO TABS: 5.0000 mg | ORAL_TABLET | Freq: Once | ORAL | Status: DC | PRN

## 2019-12-29 MED ORDER — BELLADONNA ALKALOIDS-OPIUM 16.2-60 MG RE SUPP
RECTAL | Status: DC | PRN
Start: 2019-12-29 — End: 2019-12-29
  Administered 2019-12-29: 1 via RECTAL

## 2019-12-29 MED ORDER — ATORVASTATIN CALCIUM 20 MG PO TABS
40.0000 mg | ORAL_TABLET | Freq: Every day | ORAL | Status: DC
  Administered 2019-12-29 – 2019-12-30 (×2): 40 mg via ORAL
  Filled 2019-12-29 (×2): qty 2

## 2019-12-29 MED ORDER — ONDANSETRON HCL 4 MG PO TABS: 4.0000 mg | ORAL_TABLET | Freq: Four times a day (QID) | ORAL | Status: DC | PRN

## 2019-12-29 MED ORDER — OXYCODONE HCL 5 MG/5ML PO SOLN: 5.0000 mg | Freq: Once | ORAL | Status: DC | PRN

## 2019-12-29 MED ORDER — SODIUM CHLORIDE 0.9 % IV SOLN: INTRAVENOUS | Status: DC

## 2019-12-29 MED ORDER — MIDAZOLAM HCL 2 MG/2ML IJ SOLN
INTRAMUSCULAR | Status: DC | PRN
  Administered 2019-12-29: 2 mg via INTRAVENOUS

## 2019-12-29 MED ORDER — FENTANYL CITRATE (PF) 100 MCG/2ML IJ SOLN
INTRAMUSCULAR | Status: AC
  Filled 2019-12-29: qty 2

## 2019-12-29 MED ORDER — ONDANSETRON HCL 4 MG/2ML IJ SOLN
4.0000 mg | Freq: Four times a day (QID) | INTRAMUSCULAR | Status: DC | PRN
  Administered 2019-12-30: 4 mg via INTRAVENOUS
  Filled 2019-12-29: qty 2

## 2019-12-29 MED ORDER — LIDOCAINE HCL (CARDIAC) PF 100 MG/5ML IV SOSY
PREFILLED_SYRINGE | INTRAVENOUS | Status: DC | PRN
  Administered 2019-12-29: 80 mg via INTRAVENOUS

## 2019-12-29 MED ORDER — MIDAZOLAM HCL 2 MG/2ML IJ SOLN
INTRAMUSCULAR | Status: AC
  Filled 2019-12-29: qty 2

## 2019-12-29 SURGICAL SUPPLY — 19 items
BAG DRAIN CYSTO-URO LG1000N (MISCELLANEOUS) ×2 IMPLANT
CATH URETL 5X70 OPEN END (CATHETERS) ×2 IMPLANT
CNTNR SPEC 2.5X3XGRAD LEK (MISCELLANEOUS) ×1
CONT SPEC 4OZ STER OR WHT (MISCELLANEOUS) ×1
CONTAINER SPEC 2.5X3XGRAD LEK (MISCELLANEOUS) ×1 IMPLANT
GLOVE BIO SURGEON STRL SZ8 (GLOVE) ×2 IMPLANT
GOWN STRL REUS W/ TWL LRG LVL4 (GOWN DISPOSABLE) ×1 IMPLANT
GOWN STRL REUS W/ TWL XL LVL3 (GOWN DISPOSABLE) ×1 IMPLANT
GOWN STRL REUS W/TWL LRG LVL4 (GOWN DISPOSABLE) ×1
GOWN STRL REUS W/TWL XL LVL3 (GOWN DISPOSABLE) ×1
GUIDEWIRE STR DUAL SENSOR (WIRE) ×4 IMPLANT
KIT TURNOVER CYSTO (KITS) ×2 IMPLANT
PACK CYSTO AR (MISCELLANEOUS) ×2 IMPLANT
SET CYSTO W/LG BORE CLAMP LF (SET/KITS/TRAYS/PACK) ×2 IMPLANT
SOL .9 NS 3000ML IRR  AL (IV SOLUTION) ×1
SOL .9 NS 3000ML IRR UROMATIC (IV SOLUTION) ×1 IMPLANT
STENT URET 6FRX24 CONTOUR (STENTS) ×2 IMPLANT
STENT URET 6FRX26 CONTOUR (STENTS) IMPLANT
WATER STERILE IRR 1000ML POUR (IV SOLUTION) ×2 IMPLANT

## 2019-12-29 NOTE — ED Notes (Signed)
[facility] that [facility]t provider making rounds and then will have to consult urology for this pt. Will make provider here aware of pt's request for surgery here.

## 2019-12-29 NOTE — Transfer of Care (Signed)
Immediate Anesthesia Transfer of Care Note  Patient: Christina Sawyer  Procedure(s) Performed: CYSTOSCOPY WITH STENT PLACEMENT (Left Ureter)  Patient Location: PACU  Anesthesia Type:General  Level of Consciousness: drowsy  Airway & Oxygen Therapy: Patient Spontanous Breathing and Patient connected to face mask oxygen  Post-op Assessment: Report given to RN and Post -op Vital signs reviewed and stable  Post vital signs: Reviewed and stable  Last Vitals:  Vitals Value Taken Time  BP 130/81 12/29/19 1236  Temp 36.5 C 12/29/19 1235  Pulse 98 12/29/19 1240  Resp 13 12/29/19 1240  SpO2 100 % 12/29/19 1240  Vitals shown include unvalidated device data.  Last Pain:  Vitals:   12/29/19 1235  TempSrc:   PainSc: Asleep         Complications: No complications documented.

## 2019-12-29 NOTE — Anesthesia Procedure Notes (Signed)
Procedure Name: Intubation Date/Time: 12/29/2019 12:00 PM Performed by: Katherine Basset, CRNA Pre-anesthesia Checklist: Patient identified, Emergency Drugs available, Suction available and Patient being monitored Patient Re-evaluated:Patient Re-evaluated prior to induction Oxygen Delivery Method: Circle system utilized Preoxygenation: Pre-oxygenation with 100% oxygen Induction Type: IV induction Ventilation: Mask ventilation without difficulty Laryngoscope Size: Miller and 2 Grade View: Grade I Tube type: Oral Tube size: 7.0 mm Number of attempts: 1 Airway Equipment and Method: Stylet and Oral airway Placement Confirmation: ETT inserted through vocal cords under direct vision,  positive ETCO2 and breath sounds checked- equal and bilateral Secured at: 20 cm Tube secured with: Tape Dental Injury: Teeth and Oropharynx as per pre-operative assessment

## 2019-12-29 NOTE — Discharge Instructions (Signed)

## 2019-12-29 NOTE — ED Notes (Signed)
Pt called out crying, entered room and pt states she "wants to sign out. I want an ambulance to take me to the [facility] or [facility]". Attempted to reassure pt, notified provider and provider went to speak to pt. Pt requests to have [facility] and [facility]. More pain medication given.

## 2019-12-29 NOTE — Anesthesia Preprocedure Evaluation (Addendum)
Anesthesia Evaluation  Patient identified by MRN, date of birth, ID band Patient awake    Reviewed: Allergy & Precautions, H&P , NPO status , Patient's Chart, lab work & pertinent test results  History of Anesthesia Complications (+) PONV  Airway Mallampati: II  TM Distance: >3 FB     Dental  (+) Poor Dentition, Loose   Pulmonary neg pulmonary ROS, neg sleep apnea, neg COPD,    breath sounds clear to auscultation       Cardiovascular (-) angina(-) Past MI and (-) Cardiac Stents negative cardio ROS  (-) dysrhythmias  Rhythm:regular Rate:Tachycardia     Neuro/Psych negative neurological ROS  negative psych ROS   GI/Hepatic negative GI ROS, Neg liver ROS,   Endo/Other  negative endocrine ROS  Renal/GU Renal diseaseRenal stone, AKI     Musculoskeletal   Abdominal   Peds  Hematology negative hematology ROS (+)   Anesthesia Other Findings +N/V  Past Medical History: No date: Bladder incontinence     Comment:  "interstim implant"- Right flank No date: Complication of anesthesia     Comment:  "Stadol caused over sedation" No date: Hypercholesteremia No date: Kidney stone     Comment:  at present-has stent in place. 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"  Past Surgical History: 12 2014: ANAL SPHINCTEROPLASTY; N/A No date: APPENDECTOMY No date: CHOLECYSTECTOMY 04/15/2015: CYSTOSCOPY W/ URETERAL STENT PLACEMENT; Left     Comment:  Procedure: CYSTOSCOPY WITH RETROGRADE PYELOGRAM/URETERAL              STENT PLACEMENT;  Surgeon: Marcine Matar, MD;                Location: [facility];  Service: Urology;  Laterality: Left; 05/22/2015: CYSTOSCOPY WITH URETEROSCOPY AND STENT PLACEMENT; Left     Comment:  Procedure: CYSTOSCOPY WITH URETEROSCOPY, STONE BASKETRY               AND STENT EXCHANGE;  Surgeon: Marcine Matar, MD;                 Location: [facility];  Service: Urology;  Laterality: Left; No date: HIP SURGERY     Comment:  right 05/22/2015: HOLMIUM LASER APPLICATION; Left     Comment:  Procedure: HOLMIUM LASER APPLICATION;  Surgeon: Marcine Matar, MD;  Location: [facility];  Service: Urology;                Laterality: Left; No date: TUBAL LIGATION  BMI    Body Mass Index: 25.50 kg/m      Reproductive/Obstetrics negative OB ROS                            Anesthesia Physical Anesthesia Plan  ASA: II  Anesthesia Plan: General ETT and Rapid Sequence   Post-op Pain Management:    Induction:   PONV Risk Score and Plan: Ondansetron, Dexamethasone and Treatment may vary due to age or medical condition  Airway Management Planned:   Additional Equipment:   Intra-op Plan:   Post-operative Plan:   Informed Consent: I have reviewed the patients History and Physical, chart, labs and discussed the procedure including the risks, benefits and alternatives for the proposed anesthesia with the patient or authorized representative who has indicated his/her understanding and acceptance.     Dental  Advisory Given  Plan Discussed with: Anesthesiologist, CRNA and Surgeon  Anesthesia Plan Comments:        Anesthesia Quick Evaluation

## 2019-12-29 NOTE — Progress Notes (Signed)
Patient admitted to the floor. VS WNL. Patient is alert and oriented x 4. Denies pain. Verbalizes the need to urinate. Pt helped to bedside commode. Pt was able to void with no complications. X 2

## 2019-12-29 NOTE — ED Provider Notes (Signed)
Patient having worsening pain.  We have made multiple attempts to contact providers at the [facility] to accept patient for management.  It is unclear as to whether there is even urology on-call at the facility at this time.  Will discuss with hospitalist and urology here for admission medical treatment.   Willy Eddy, MD 12/29/19 1031

## 2019-12-29 NOTE — ED Provider Notes (Signed)
[facility] Emergency Department Provider Note  ____________________________________________  Time seen: Approximately 1:45 AM  I have reviewed the triage vital signs and the nursing notes.   HISTORY  Chief Complaint Flank Pain    HPI Christina HartshornLinda Sawyer is a 59 y.o. female with a past history of kidney stones, UTIs who complains of left flank pain radiating to left lower quadrant that started at about 7:00 PM this evening.  Constant, waxing and waning, no aggravating or alleviating factors, severe, sharp.      Past Medical History:  Diagnosis Date  . Bladder incontinence    "interstim implant"- Right flank  . Complication of anesthesia    "Stadol caused over sedation"  . Hypercholesteremia   . Kidney stone    at present-has stent in place.  Marland Kitchen. PONV (postoperative nausea and vomiting)   . Tuberculosis    "latent tuberculosis-tx. 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"     Patient Active Problem List   Diagnosis Date Noted  . Acute unilateral obstructive uropathy 04/14/2015  . Hydronephrosis of left kidney 04/14/2015  . Hyperkalemia 04/14/2015  . Leucocytosis 04/14/2015  . UTI (urinary tract infection) 04/14/2015  . Nausea 04/14/2015     Past Surgical History:  Procedure Laterality Date  . ANAL SPHINCTEROPLASTY N/A 12 2014  . APPENDECTOMY    . CHOLECYSTECTOMY    . CYSTOSCOPY W/ URETERAL STENT PLACEMENT Left 04/15/2015   Procedure: CYSTOSCOPY WITH RETROGRADE PYELOGRAM/URETERAL STENT PLACEMENT;  Surgeon: Marcine MatarStephen Dahlstedt, MD;  Location: [facility];  Service: Urology;  Laterality: Left;  . CYSTOSCOPY WITH URETEROSCOPY AND STENT PLACEMENT Left 05/22/2015   Procedure: CYSTOSCOPY WITH URETEROSCOPY, STONE BASKETRY AND STENT EXCHANGE;  Surgeon: Marcine MatarStephen Dahlstedt, MD;  Location: [facility];  Service: Urology;  Laterality: Left;  . HIP SURGERY     right  . HOLMIUM LASER APPLICATION Left 05/22/2015   Procedure: HOLMIUM LASER APPLICATION;  Surgeon: Marcine MatarStephen Dahlstedt, MD;  Location: [facility];   Service: Urology;  Laterality: Left;  . TUBAL LIGATION       Prior to Admission medications   Medication Sig Start Date End Date Taking? Authorizing Provider  atorvastatin (LIPITOR) 40 MG tablet Take 40 mg by mouth at bedtime.    [provider]  Chlorphen-Pseudoephed-APAP (THERAFLU FLU/COLD PO) Take by mouth.    [provider]  conjugated estrogens (PREMARIN) vaginal cream Place 1 Applicatorful vaginally every 3 (three) days.    [provider]  ibuprofen (ADVIL,MOTRIN) 200 MG tablet Take 200 mg by mouth every 6 (six) hours as needed.    [provider]  neomycin-polymyxin-hydrocortisone (CORTISPORIN) 3.5-10000-1 otic suspension Place 4 drops into the right ear 4 (four) times daily. For 5 days 02/04/16   Charm RingsHonig, Erin J, MD  nitrofurantoin, macrocrystal-monohydrate, (MACROBID) 100 MG capsule Take 1 capsule (100 mg total) by mouth 2 (two) times daily. X 7 days 04/03/18   Dione BoozeGlick, David, MD  traMADol (ULTRAM) 50 MG tablet Take 1 tablet (50 mg total) by mouth every 12 (twelve) hours as needed. Patient not taking: Reported on 02/04/2016 05/22/15   Marcine Matarahlstedt, Stephen, MD  traMADol (ULTRAM) 50 MG tablet Take 1 tablet (50 mg total) by mouth every 12 (twelve) hours as needed for moderate pain. Patient not taking: Reported on 02/04/2016 05/22/15   Marcine Matarahlstedt, Stephen, MD     Allergies Stadol [butorphanol]   No family history on file.  Social History Social History   Tobacco Use  . Smoking status: Passive Smoke Exposure - Never Smoker  . Smokeless tobacco: Never Used  Vaping  Use  . Vaping Use: Never used  Substance Use Topics  . Alcohol use: Yes    Alcohol/week: 1.0 standard drink    Types: 1 Glasses of wine per week    Comment: wine occ  . Drug use: No    Review of Systems  Constitutional:   No fever or chills.  ENT:   No sore throat. No rhinorrhea. Cardiovascular:   No chest pain or syncope. Respiratory:   No dyspnea or cough. Gastrointestinal:    Left leg pain as above.  Positive vomiting.  No constipation. Musculoskeletal:   Negative for focal pain or swelling All other systems reviewed and are negative except as documented above in ROS and HPI.  ____________________________________________   PHYSICAL EXAM:  VITAL SIGNS: ED Triage Vitals  Enc Vitals Group     BP 12/28/19 2047 (!) 166/87     Pulse Rate 12/28/19 2047 72     Resp 12/28/19 2047 20     Temp 12/28/19 2047 98.4 F (36.9 C)     Temp Source 12/28/19 2047 Oral     SpO2 12/28/19 2047 97 %     Weight 12/28/19 2050 158 lb (71.7 kg)     Height 12/28/19 2050 5\' 6"  (1.676 m)     Head Circumference --      Peak Flow --      Pain Score 12/28/19 2047 5     Pain Loc --      Pain Edu? --      Excl. in GC? --     Vital signs reviewed, nursing assessments reviewed.   Constitutional:   Alert and oriented. Non-toxic appearance. Eyes:   Conjunctivae are normal. EOMI. PERRL. ENT      Head:   Normocephalic and atraumatic.      Nose:   Wearing a mask.      Mouth/Throat:   Wearing a mask.      Neck:   No meningismus. Full ROM. Hematological/Lymphatic/Immunilogical:   No cervical lymphadenopathy. Cardiovascular:   RRR. Symmetric bilateral radial and DP pulses.  No murmurs. Cap refill less than 2 seconds. Respiratory:   Normal respiratory effort without tachypnea/retractions. Breath sounds are clear and equal bilaterally. No wheezes/rales/rhonchi. Gastrointestinal:   Soft and nontender. Non distended. There is no CVA tenderness.  No rebound, rigidity, or guarding. Musculoskeletal:   Normal range of motion in all extremities. No joint effusions.  No lower extremity tenderness.  No edema. Neurologic:   Normal speech and language.  Motor grossly intact. No acute focal neurologic deficits are appreciated.  Skin:    Skin is warm, dry and intact. No rash noted.  No petechiae, purpura, or bullae.  ____________________________________________    LABS (pertinent  positives/negatives) (all labs ordered are listed, but only abnormal results are displayed) Labs Reviewed  COMPREHENSIVE METABOLIC PANEL - Abnormal; Notable for the following components:      Result Value   Glucose, Bld 198 (*)    BUN 22 (*)    Creatinine, Ser 1.18 (*)    GFR, Estimated 51 (*)    All other components within normal limits  CBC - Abnormal; Notable for the following components:   WBC 11.7 (*)    All other components within normal limits  URINALYSIS, COMPLETE (UACMP) WITH MICROSCOPIC - Abnormal; Notable for the following components:   Color, Urine YELLOW (*)    APPearance CLOUDY (*)    Hgb urine dipstick MODERATE (*)    Protein, ur 30 (*)    Nitrite POSITIVE (*)  Leukocytes,Ua LARGE (*)    WBC, UA >50 (*)    Bacteria, UA FEW (*)    All other components within normal limits  RESPIRATORY PANEL BY RT PCR (FLU A&B, COVID)  URINE CULTURE   ____________________________________________   EKG    ____________________________________________    RADIOLOGY  CT Renal Stone Study  Result Date: 12/28/2019 CLINICAL DATA:  Left-sided flank pain history of renal calculi EXAM: CT ABDOMEN AND PELVIS WITHOUT CONTRAST TECHNIQUE: Multidetector CT imaging of the abdomen and pelvis was performed following the standard protocol without IV contrast. COMPARISON:  April 03, 2018 FINDINGS: Lower chest: The visualized heart size within normal limits. No pericardial fluid/thickening. No hiatal hernia. The visualized portions of the lungs are clear. Hepatobiliary: Although limited due to the lack of intravenous contrast, normal in appearance without gross focal abnormality. The patient is status post cholecystectomy. No biliary ductal dilation. Pancreas:  Unremarkable.  No surrounding inflammatory changes. Spleen: Normal in size. Although limited due to the lack of intravenous contrast, normal in appearance. Adrenals/Urinary Tract: Both adrenal glands appear normal. There is moderate  left-sided pelvicaliectasis and proximal ureterectasis with a 9 mm calculus within the proximal left ureter. There is left-sided perinephric and periureteral stranding changes. Punctate calcifications seen in the lower pole of the left kidney. There is scattered multiple calcifications seen in the lower pole of the right kidney the largest measuring 3 mm. The bladder is unremarkable. Stomach/Bowel: The stomach, small bowel, and colon are normal in appearance. No inflammatory changes or obstructive findings. A moderate amount colonic stool is present. Vascular/Lymphatic: There are no enlarged abdominal or pelvic lymph nodes. Scattered aortic atherosclerotic calcifications are seen without aneurysmal dilatation. Reproductive: The uterus and adnexa are unremarkable. Other: No evidence of abdominal wall mass or hernia. Musculoskeletal: No acute or significant osseous findings. Degenerative changes are seen in the thoracolumbar spine with endplate Schmorl's nodes. A overlying nerve stimulator is seen. IMPRESSION: Moderate left hydronephrosis with perinephric stranding changes from a proximal 9 mm ureteral calculus. Multiple punctate bilateral nonobstructing renal calculi Aortic Atherosclerosis (ICD10-I70.0). Electronically Signed   By: Jonna Clark M.D.   On: 12/28/2019 23:29    ____________________________________________   PROCEDURES Procedures  ____________________________________________  DIFFERENTIAL DIAGNOSIS   Obstructing kidney stone, pyelonephritis  CLINICAL IMPRESSION / ASSESSMENT AND PLAN / ED COURSE  Medications ordered in the ED: Medications  fentaNYL (SUBLIMAZE) injection 50 mcg ( Nasal Not Given 12/29/19 0412)  ondansetron (ZOFRAN-ODT) disintegrating tablet 4 mg (4 mg Oral Given 12/28/19 2055)  ibuprofen (ADVIL) tablet 400 mg (400 mg Oral Given 12/28/19 2055)  sodium chloride 0.9 % bolus 1,000 mL (0 mLs Intravenous Stopped 12/29/19 0507)  ketorolac (TORADOL) 30 MG/ML injection 15 mg  (15 mg Intravenous Given 12/29/19 0156)  cefTRIAXone (ROCEPHIN) 2 g in sodium chloride 0.9 % 100 mL IVPB (0 g Intravenous Stopped 12/29/19 0507)  fentaNYL (SUBLIMAZE) injection 50 mcg (50 mcg Intravenous Given 12/29/19 0415)    Pertinent labs & imaging results that were available during my care of the patient were reviewed by me and considered in my medical decision making (see chart for details).  Christina Sawyer was evaluated in Emergency Department on 12/29/2019 for the symptoms described in the history of present illness. She was evaluated in the context of the global COVID-19 pandemic, which necessitated consideration that the patient might be at risk for infection with the SARS-CoV-2 virus that causes COVID-19. Institutional protocols and algorithms that pertain to the evaluation of patients at risk for COVID-19 are in a state  of rapid change based on information released by regulatory bodies including the CDC and federal and state organizations. These policies and algorithms were followed during the patient's care in the ED.   Patient presents with left flank pain radiating to left lower quadrant.  Serum labs unremarkable, no significant AKI.  CT scan shows 9 mm stone in the left proximal ureter causing obstruction with hydronephrosis and perinephric stranding.  We will follow-up urinalysis.  Will give IV Toradol for now for pain relief  ----------------------------------------- 4:53 AM on 12/29/2019 -----------------------------------------  Urinalysis showed clear evidence of UTI.  Urine culture ordered, Rocephin ordered.  Patient still having substantial pain, repeat dose of fentanyl ordered.  She gets all her care at the [facility], she is not septic currently so we will initiate a transfer request.  If they are unable to receive her soon or lack urology coverage this weekend, we will need to proceed with local urology treatment at [facility] regional.       ____________________________________________   FINAL CLINICAL IMPRESSION(S) / ED DIAGNOSES    Final diagnoses:  Obstruction of left ureter  Cystitis  Ureterolithiasis     ED Discharge Orders    None      Portions of this note were generated with dragon dictation software. Dictation errors may occur despite best attempts at proofreading.   Sharman Cheek, MD 12/29/19 [phone]

## 2019-12-29 NOTE — Op Note (Signed)
Procedure: 1.  Cystoscopy with insertion of left double-J stent. 2.  Application of fluoroscopy.  Preop diagnosis: 9 mm left UPJ stone with obstruction and infection.  Postop diagnosis: Same.  Surgeon: Dr. Bjorn Pippin.  Anesthesia: General.  Specimen: Urine culture from left renal pelvis.  Drains: 6 French by 24 cm left contour double-J stent without tether.  EBL: None.  Complications: None.  Indications: The patient is a 59 year old female with a history of stones who presented to the emergency room with left flank pain, chills and nausea with vomiting.  She was found to have a 9 mm left UPJ stone with obstruction and a probable UTI with mild AKI but no fever.  It was felt that stenting was indicated.  Procedure: She had been given Rocephin in the emergency room.  She was taken the operating room where a general anesthetic was induced.  She was placed in lithotomy position.  Her perineum and genitalia were prepped with Betadine scrub.  She was draped in usual sterile fashion.  Cystoscopy was performed using the 23 [place] scope and 30 degree lens.  Examination revealed a normal urethra.  The bladder wall was smooth and pale without tumors, stones or inflammation.  Ureteral orifices were in the normal anatomic position.  The left ureteral orifice was cannulated with a 5 French opening catheter which was advanced to the level just below the stone under fluoroscopic guidance.  A sensor wire was then passed through the opening catheter and was negotiated by the stone.  The stone appeared to be impacted.  I was able to advance the 5 [place] open-ended catheter over the wire into the renal pelvis.  The wire was removed and a brisk hydronephrotic drip was noted.  A urine specimen was obtained for culture.  The sensor wire was then replaced and the ureteral catheter was removed.  There was E flux of bloody turbid urine around the wire.  A 6 French by 24 cm contour double-J stent without tether was  then passed to the kidney under fluoroscopic guidance.  Initially there was resistance at the UPJ but the stent then popped and suggesting the stone was dislodged.  Once the tip of the stent was in good position, the wire was removed and a good coil was noted formed in the kidney and then the bladder.  Turbid urine effluxed from the distal stent loop.  The bladder was then drained and the cystoscope was removed.  A B&O suppository was placed.  She was taken down from lithotomy position, her anesthetic was reversed and she was moved to recovery in stable condition.  There were no complications.

## 2019-12-29 NOTE — ED Notes (Signed)
[facility] and get update on [facility] status. If it's "too long", she wants to have her surgery here instead of waiting. Pt crying, given pain medication. Asked Diplomatic Services operational officer to call [facility] for update and will update PT and provider accordingly.

## 2019-12-29 NOTE — Anesthesia Postprocedure Evaluation (Signed)
Anesthesia Post Note  Patient: Christina Sawyer  Procedure(s) Performed: CYSTOSCOPY WITH STENT PLACEMENT (Left Ureter)  Patient location during evaluation: PACU Anesthesia Type: General Level of consciousness: awake and alert Pain management: pain level controlled Vital Signs Assessment: post-procedure vital signs reviewed and stable Respiratory status: spontaneous breathing, nonlabored ventilation and respiratory function stable Cardiovascular status: blood pressure returned to baseline and stable Postop Assessment: no apparent nausea or vomiting Anesthetic complications: no   No complications documented.   Last Vitals:  Vitals:   12/29/19 1646 12/29/19 1811  BP: 128/81 135/86  Pulse: 76 71  Resp:    Temp: 37 C 36.8 C  SpO2: 95% 96%    Last Pain:  Vitals:   12/29/19 1811  TempSrc: Oral  PainSc:                  [name]

## 2019-12-29 NOTE — ED Notes (Signed)
Pt assisted to commode.

## 2019-12-29 NOTE — H&P (Signed)
History and Physical    Christina Sawyer TMH:[id number] DOB: 06/23/1960 DOA: 12/29/2019  PCP: Concha Pyo, MD   Patient coming from: Home  I have personally briefly reviewed patient's old medical records in [facility]  Chief Complaint: Abdominal pain  HPI: Christina Sawyer is a 59 y.o. female with medical history significant for nephrolithiasis, dyslipidemia who presents to the emergency room for evaluation of abdominal pain mostly in the left flank which she describes as a severe and sharp pain rated 10 x 10 in intensity noted worst.  Pain radiates to the left lower quadrant and started about 7 PM on the day prior to her admission.  She describes the pain as a constant, waxing and waning pain with no aggravating or relieving factors.  Pain is associated with nausea and chills but no vomiting or fever She denies having any chest pain, no shortness of breath, no diaphoresis, no palpitations, no dizziness or lightheadedness. Labs show sodium 140, potassium 4.4, chloride 107, bicarb 28, glucose 198,, BUN 22, creatinine 1.18, calcium 10.1, albumin 4.4, AST 21, ALT 24, total protein 7.9, white count 11.7, hemoglobin 14.5, hematocrit 42.7, MCV 86.4, RDW 12.4, platelet count 151 Respiratory viral panel is negative Patient has pyuria CT renal study shows moderate left hydronephrosis with perinephric stranding changes from proximal 9 mm ureteral calculus.  Multiple punctate bilateral nonobstructing renal calculi. Twelve-lead EKG shows sinus tachycardia   ED Course: Patient is a 59 year old female with a history significant for nephrolithiasis who presents to the ER for evaluation of abdominal pain mostly in the left flank with radiation to the left lower quadrant associated with nausea, vomiting and chills.  Patient has pyuria and CT renal study shows moderate left hydronephrosis with perinephric stranding.  She will be admitted to the hospital.  Urology has been consulted  Review of Systems: As per HPI  otherwise 10 point review of systems negative.    Past Medical History:  Diagnosis Date  . Bladder incontinence    "interstim implant"- Right flank  . Complication of anesthesia    "Stadol caused over sedation"  . Hypercholesteremia   . Kidney stone    at present-has stent in place.  Marland Kitchen PONV (postoperative nausea and vomiting)   . Tuberculosis    "latent tuberculosis-tx. 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"    Past Surgical History:  Procedure Laterality Date  . ANAL SPHINCTEROPLASTY N/A 12 2014  . APPENDECTOMY    . CHOLECYSTECTOMY    . CYSTOSCOPY W/ URETERAL STENT PLACEMENT Left 04/15/2015   Procedure: CYSTOSCOPY WITH RETROGRADE PYELOGRAM/URETERAL STENT PLACEMENT;  Surgeon: Marcine Matar, MD;  Location: [facility];  Service: Urology;  Laterality: Left;  . CYSTOSCOPY WITH URETEROSCOPY AND STENT PLACEMENT Left 05/22/2015   Procedure: CYSTOSCOPY WITH URETEROSCOPY, STONE BASKETRY AND STENT EXCHANGE;  Surgeon: Marcine Matar, MD;  Location: [facility];  Service: Urology;  Laterality: Left;  . HIP SURGERY     right  . HOLMIUM LASER APPLICATION Left 05/22/2015   Procedure: HOLMIUM LASER APPLICATION;  Surgeon: Marcine Matar, MD;  Location: [facility];  Service: Urology;  Laterality: Left;  . TUBAL LIGATION       reports that she is a non-smoker but has been exposed to tobacco smoke. She has never used smokeless tobacco. She reports current alcohol use of about 1.0 standard drink of alcohol per week. She reports that she does not use drugs.  Allergies  Allergen Reactions  . Butorphanol Other (See Comments)    "goes under""over sedation" Sensitivity     No family  history on file.   Prior to Admission medications   Medication Sig Start Date End Date Taking? Authorizing Provider  atorvastatin (LIPITOR) 40 MG tablet Take 40 mg by mouth at bedtime.    [provider]  Chlorphen-Pseudoephed-APAP (THERAFLU FLU/COLD PO) Take by mouth.    [provider]  conjugated estrogens (PREMARIN)  vaginal cream Place 1 Applicatorful vaginally every 3 (three) days.    [provider]  ibuprofen (ADVIL,MOTRIN) 200 MG tablet Take 200 mg by mouth every 6 (six) hours as needed.    [provider]  neomycin-polymyxin-hydrocortisone (CORTISPORIN) 3.5-10000-1 otic suspension Place 4 drops into the right ear 4 (four) times daily. For 5 days 02/04/16   Charm RingsHonig, Erin J, MD  nitrofurantoin, macrocrystal-monohydrate, (MACROBID) 100 MG capsule Take 1 capsule (100 mg total) by mouth 2 (two) times daily. X 7 days 04/03/18   Dione BoozeGlick, David, MD  traMADol (ULTRAM) 50 MG tablet Take 1 tablet (50 mg total) by mouth every 12 (twelve) hours as needed. Patient not taking: Reported on 02/04/2016 05/22/15   Marcine Matarahlstedt, Stephen, MD  traMADol (ULTRAM) 50 MG tablet Take 1 tablet (50 mg total) by mouth every 12 (twelve) hours as needed for moderate pain. Patient not taking: Reported on 02/04/2016 05/22/15   Marcine Matarahlstedt, Stephen, MD    Physical Exam: Vitals:   12/29/19 0300 12/29/19 0400 12/29/19 0515 12/29/19 0600  BP: (!) 158/90 (!) 164/82 (!) 149/79 (!) 152/83  Pulse: 83 98 (!) 102 98  Resp:      Temp:      TempSrc:      SpO2: 98% 99% 95% 92%  Weight:      Height:         Vitals:   12/29/19 0300 12/29/19 0400 12/29/19 0515 12/29/19 0600  BP: (!) 158/90 (!) 164/82 (!) 149/79 (!) 152/83  Pulse: 83 98 (!) 102 98  Resp:      Temp:      TempSrc:      SpO2: 98% 99% 95% 92%  Weight:      Height:        Constitutional: NAD, alert and oriented x 3.  Appears uncomfortable and in painful distress Eyes: PERRL, lids and conjunctivae normal ENMT: Mucous membranes are moist.  Neck: normal, supple, no masses, no thyromegaly Respiratory: clear to auscultation bilaterally, no wheezing, no crackles. Normal respiratory effort. No accessory muscle use.  Cardiovascular: Tachycardia, no murmurs / rubs / gallops. No extremity edema. 2+ pedal pulses. No carotid bruits.  Abdomen: Left CVA tenderness, no masses  palpated. No hepatosplenomegaly. Bowel sounds positive.  Musculoskeletal: no clubbing / cyanosis. No joint deformity upper and lower extremities.  Skin: no rashes, lesions, ulcers.  Neurologic: No gross focal neurologic deficit. Psychiatric: Normal mood and affect.   Labs on Admission: I have personally reviewed following labs and imaging studies  CBC: Recent Labs  Lab 12/28/19 2053  WBC 11.7*  HGB 14.5  HCT 42.7  MCV 86.4  PLT 151   Basic Metabolic Panel: Recent Labs  Lab 12/28/19 2053  NA 140  K 4.4  CL 104  CO2 28  GLUCOSE 198*  BUN 22*  CREATININE 1.18*  CALCIUM 10.1   GFR: Estimated Creatinine Clearance: 53.4 mL/min (A) (by C-G formula based on SCr of 1.18 mg/dL (H)). Liver Function Tests: Recent Labs  Lab 12/28/19 2053  AST 21  ALT 24  ALKPHOS 118  BILITOT 1.0  PROT 7.9  ALBUMIN 4.4   No results for input(s): LIPASE, AMYLASE in the last  168 hours. No results for input(s): AMMONIA in the last 168 hours. Coagulation Profile: No results for input(s): INR, PROTIME in the last 168 hours. Cardiac Enzymes: No results for input(s): CKTOTAL, CKMB, CKMBINDEX, TROPONINI in the last 168 hours. BNP (last 3 results) No results for input(s): PROBNP in the last 8760 hours. HbA1C: No results for input(s): HGBA1C in the last 72 hours. CBG: No results for input(s): GLUCAP in the last 168 hours. Lipid Profile: No results for input(s): CHOL, HDL, LDLCALC, TRIG, CHOLHDL, LDLDIRECT in the last 72 hours. Thyroid Function Tests: No results for input(s): TSH, T4TOTAL, FREET4, T3FREE, THYROIDAB in the last 72 hours. Anemia Panel: No results for input(s): VITAMINB12, FOLATE, FERRITIN, TIBC, IRON, RETICCTPCT in the last 72 hours. Urine analysis:    Component Value Date/Time   COLORURINE YELLOW (A) 12/29/2019 0324   APPEARANCEUR CLOUDY (A) 12/29/2019 0324   LABSPEC 1.021 12/29/2019 0324   PHURINE 6.0 12/29/2019 0324   GLUCOSEU NEGATIVE 12/29/2019 0324   HGBUR MODERATE  (A) 12/29/2019 0324   BILIRUBINUR NEGATIVE 12/29/2019 0324   KETONESUR NEGATIVE 12/29/2019 0324   PROTEINUR 30 (A) 12/29/2019 0324   NITRITE POSITIVE (A) 12/29/2019 0324   LEUKOCYTESUR LARGE (A) 12/29/2019 0324    Radiological Exams on Admission: DG OR UROLOGY CYSTO IMAGE ([facility] ONLY)  Result Date: 12/29/2019 There is no interpretation for this exam.  This order is for images obtained during a surgical procedure.  Please See "Surgeries" Tab for more information regarding the procedure.   CT Renal Stone Study  Result Date: 12/28/2019 CLINICAL DATA:  Left-sided flank pain history of renal calculi EXAM: CT ABDOMEN AND PELVIS WITHOUT CONTRAST TECHNIQUE: Multidetector CT imaging of the abdomen and pelvis was performed following the standard protocol without IV contrast. COMPARISON:  April 03, 2018 FINDINGS: Lower chest: The visualized heart size within normal limits. No pericardial fluid/thickening. No hiatal hernia. The visualized portions of the lungs are clear. Hepatobiliary: Although limited due to the lack of intravenous contrast, normal in appearance without gross focal abnormality. The patient is status post cholecystectomy. No biliary ductal dilation. Pancreas:  Unremarkable.  No surrounding inflammatory changes. Spleen: Normal in size. Although limited due to the lack of intravenous contrast, normal in appearance. Adrenals/Urinary Tract: Both adrenal glands appear normal. There is moderate left-sided pelvicaliectasis and proximal ureterectasis with a 9 mm calculus within the proximal left ureter. There is left-sided perinephric and periureteral stranding changes. Punctate calcifications seen in the lower pole of the left kidney. There is scattered multiple calcifications seen in the lower pole of the right kidney the largest measuring 3 mm. The bladder is unremarkable. Stomach/Bowel: The stomach, small bowel, and colon are normal in appearance. No inflammatory changes or obstructive findings. A  moderate amount colonic stool is present. Vascular/Lymphatic: There are no enlarged abdominal or pelvic lymph nodes. Scattered aortic atherosclerotic calcifications are seen without aneurysmal dilatation. Reproductive: The uterus and adnexa are unremarkable. Other: No evidence of abdominal wall mass or hernia. Musculoskeletal: No acute or significant osseous findings. Degenerative changes are seen in the thoracolumbar spine with endplate Schmorl's nodes. A overlying nerve stimulator is seen. IMPRESSION: Moderate left hydronephrosis with perinephric stranding changes from a proximal 9 mm ureteral calculus. Multiple punctate bilateral nonobstructing renal calculi Aortic Atherosclerosis (ICD10-I70.0). Electronically Signed   By: Jonna Clark M.D.   On: 12/28/2019 23:29    EKG: Independently reviewed.  Sinus tachycardia  Assessment/Plan Principal Problem:   Acute unilateral obstructive uropathy Active Problems:   Hydronephrosis of left kidney   Acute  pyelonephritis      Acute unilateral obstructive uropathy Patient with a history of nephrolithiasis who presents to the ER for evaluation of left flank pain associated with nausea, vomiting and chills Renal CT shows left-sided hydronephrosis Supportive care with IV pain medication, antiemetics and IV fluid hydration Urology consult   Acute pyelonephritis Patient noted to have left CVA tenderness and chills Renal CT shows perinephric stranding Patient also has leukocytosis Prior urine culture yielded E. coli sensitive to cephalosporins We will treat patient empirically with Rocephin 1 g IV daily until urine culture results become available   Dyslipidemia Continue statins    DVT prophylaxis: SCD Code Status: Full code Family Communication: Greater than 50% of time was spent discussing patient's condition and plan of care with her at the bedside.  All questions and concerns have been addressed.  She verbalizes understanding and agrees with  the plan. Disposition Plan: Back to previous home environment Consults called: Urology    Lucile Shutters MD [facility]     12/29/2019, 11:17 AM

## 2019-12-29 NOTE — Consult Note (Addendum)
Subjective: 1. Obstruction of left ureter   2. Cystitis   3. Ureterolithiasis      CC: left flank pain.  Hx: I was asked to see Christina Sawyer in consultation by Dr. Willy EddyPatrick Robinson for management of a 9mm left ureteral stone with severe left flank pain that began at 7pm last night.  She has evidence of infection on UA with a mild leukocytosis but no fever.  She has a prior history of stones and reports passing a stone 2 weeks ago.  She required endoscopic therapy for a stone in 2017 by Dr. Retta Dionesahlstedt.  She had a Klebsiella UTI at that time and her stone was mixed calcium oxalate and carbonate apatite.   She also has a history of fecal incontinence and has an interstim device.     ROS:  Review of Systems  Constitutional: Positive for chills. Negative for fever.  Respiratory: Negative for shortness of breath.   Cardiovascular: Negative for chest pain and leg swelling.  Gastrointestinal: Positive for nausea and vomiting.  Genitourinary: Positive for flank pain. Negative for dysuria, hematuria and urgency.  All other systems reviewed and are negative.   Allergies  Allergen Reactions  . Butorphanol Other (See Comments)    "goes under""over sedation" Sensitivity     Past Medical History:  Diagnosis Date  . Bladder incontinence    "interstim implant"- Right flank  . Complication of anesthesia    "Stadol caused over sedation"  . Hypercholesteremia   . Kidney stone    at present-has stent in place.  Marland Kitchen. PONV (postoperative nausea and vomiting)   . Tuberculosis    "latent tuberculosis-tx. 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"    Past Surgical History:  Procedure Laterality Date  . ANAL SPHINCTEROPLASTY N/A 12 2014  . APPENDECTOMY    . CHOLECYSTECTOMY    . CYSTOSCOPY W/ URETERAL STENT PLACEMENT Left 04/15/2015   Procedure: CYSTOSCOPY WITH RETROGRADE PYELOGRAM/URETERAL STENT PLACEMENT;  Surgeon: Marcine MatarStephen Dahlstedt, MD;  Location: [facility];  Service: Urology;  Laterality: Left;  . CYSTOSCOPY WITH URETEROSCOPY  AND STENT PLACEMENT Left 05/22/2015   Procedure: CYSTOSCOPY WITH URETEROSCOPY, STONE BASKETRY AND STENT EXCHANGE;  Surgeon: Marcine MatarStephen Dahlstedt, MD;  Location: [facility];  Service: Urology;  Laterality: Left;  . HIP SURGERY     right  . HOLMIUM LASER APPLICATION Left 05/22/2015   Procedure: HOLMIUM LASER APPLICATION;  Surgeon: Marcine MatarStephen Dahlstedt, MD;  Location: [facility];  Service: Urology;  Laterality: Left;  . TUBAL LIGATION      Social History   Socioeconomic History  . Marital status: Legally Separated    Spouse name: Not on file  . Number of children: Not on file  . Years of education: Not on file  . Highest education level: Not on file  Occupational History  . Not on file  Tobacco Use  . Smoking status: Passive Smoke Exposure - Never Smoker  . Smokeless tobacco: Never Used  Vaping Use  . Vaping Use: Never used  Substance and Sexual Activity  . Alcohol use: Yes    Alcohol/week: 1.0 standard drink    Types: 1 Glasses of wine per week    Comment: wine occ  . Drug use: No  . Sexual activity: Not Currently  Other Topics Concern  . Not on file  Social History Narrative  . Not on file   Social Determinants of Health   Financial Resource Strain:   . Difficulty of Paying Living Expenses: Not on file  Food Insecurity:   . Worried About [employer] Out of  Food in the Last Year: Not on file  . Ran Out of Food in the Last Year: Not on file  Transportation Needs:   . Lack of Transportation (Medical): Not on file  . Lack of Transportation (Non-Medical): Not on file  Physical Activity:   . Days of Exercise per Week: Not on file  . Minutes of Exercise per Session: Not on file  Stress:   . Feeling of Stress : Not on file  Social Connections:   . Frequency of Communication with Friends and Family: Not on file  . Frequency of Social Gatherings with Friends and Family: Not on file  . Attends Religious Services: Not on file  . Active Member of Clubs or Organizations: Not on file  . Attends  Banker Meetings: Not on file  . Marital Status: Not on file  Intimate Partner Violence:   . Fear of Current or Ex-Partner: Not on file  . Emotionally Abused: Not on file  . Physically Abused: Not on file  . Sexually Abused: Not on file    No family history on file.  Anti-infectives: Anti-infectives (From admission, onward)   Start     Dose/Rate Route Frequency Ordered Stop   12/30/19 0000  cefTRIAXone (ROCEPHIN) 1 g in sodium chloride 0.9 % 100 mL IVPB        1 g 200 mL/hr over 30 Minutes Intravenous Every 24 hours 12/29/19 1112     12/29/19 0400  cefTRIAXone (ROCEPHIN) 2 g in sodium chloride 0.9 % 100 mL IVPB        2 g 200 mL/hr over 30 Minutes Intravenous  Once 12/29/19 0349 12/29/19 0507      Current Facility-Administered Medications  Medication Dose Route Frequency Provider Last Rate Last Admin  . 0.9 %  sodium chloride infusion   Intravenous Continuous Agbata, Tochukwu, MD      . acetaminophen (TYLENOL) tablet 650 mg  650 mg Oral Q6H PRN Agbata, Tochukwu, MD       Or  . acetaminophen (TYLENOL) suppository 650 mg  650 mg Rectal Q6H PRN Agbata, Tochukwu, MD      . atorvastatin (LIPITOR) tablet 40 mg  40 mg Oral QHS Agbata, Tochukwu, MD      . Melene Muller ON 12/30/2019] cefTRIAXone (ROCEPHIN) 1 g in sodium chloride 0.9 % 100 mL IVPB  1 g Intravenous Q24H Agbata, Tochukwu, MD      . darifenacin (ENABLEX) 24 hr tablet 7.5 mg  7.5 mg Oral Daily Agbata, Tochukwu, MD      . HYDROmorphone (DILAUDID) injection 1 mg  1 mg Intravenous Q4H PRN Agbata, Tochukwu, MD      . ondansetron (ZOFRAN) tablet 4 mg  4 mg Oral Q6H PRN Agbata, Tochukwu, MD       Or  . ondansetron (ZOFRAN) injection 4 mg  4 mg Intravenous Q6H PRN Agbata, Tochukwu, MD       Current Outpatient Medications  Medication Sig Dispense Refill  . conjugated estrogens (PREMARIN) vaginal cream Place 1 Applicatorful vaginally every 3 (three) days.    Marland Kitchen atorvastatin (LIPITOR) 40 MG tablet Take 40 mg by mouth at  bedtime.    Marland Kitchen ibuprofen (ADVIL,MOTRIN) 200 MG tablet Take 200 mg by mouth every 6 (six) hours as needed.    . solifenacin (VESICARE) 10 MG tablet Take 10 mg by mouth daily as needed. Take 2 hours prior to bedtime       Objective: Vital signs in last 24 hours: BP (!) 152/83   Pulse  98   Temp 98.4 F (36.9 C) (Oral)   Resp 18   Ht 5\' 6"  (1.676 m)   Wt 71.7 kg   SpO2 92%   BMI 25.50 kg/m   Intake/Output from previous day: 10/15 0701 - 10/16 0700 In: 1000 [IV Piggyback:1000] Out: -  Intake/Output this shift: No intake/output data recorded.   Physical Exam Constitutional:      Appearance: Normal appearance.  HENT:     Head: Normocephalic and atraumatic.  Cardiovascular:     Rate and Rhythm: Normal rate and regular rhythm.  Pulmonary:     Effort: Pulmonary effort is normal. No respiratory distress.  Abdominal:     General: Abdomen is flat.     Palpations: Abdomen is soft.     Tenderness: There is left CVA tenderness.  Musculoskeletal:        General: No swelling or tenderness. Normal range of motion.     Cervical back: Normal range of motion and neck supple.  Skin:    General: Skin is warm and dry.  Neurological:     General: No focal deficit present.     Mental Status: She is alert and oriented to person, place, and time.  Psychiatric:        Mood and Affect: Mood normal.        Behavior: Behavior normal.     Lab Results:  Results for orders placed or performed during the hospital encounter of 12/29/19 (from the past 24 hour(s))  Comprehensive metabolic panel     Status: Abnormal   Collection Time: 12/28/19  8:53 PM  Result Value Ref Range   Sodium 140 135 - 145 mmol/L   Potassium 4.4 3.5 - 5.1 mmol/L   Chloride 104 98 - 111 mmol/L   CO2 28 22 - 32 mmol/L   Glucose, Bld 198 (H) 70 - 99 mg/dL   BUN 22 (H) 6 - 20 mg/dL   Creatinine, Ser 12/30/19 (H) 0.44 - 1.00 mg/dL   Calcium 9.37 8.9 - 90.2 mg/dL   Total Protein 7.9 6.5 - 8.1 g/dL   Albumin 4.4 3.5 - 5.0  g/dL   AST 21 15 - 41 U/L   ALT 24 0 - 44 U/L   Alkaline Phosphatase 118 38 - 126 U/L   Total Bilirubin 1.0 0.3 - 1.2 mg/dL   GFR, Estimated 51 (L) >60 mL/min   Anion gap 8 5 - 15  CBC     Status: Abnormal   Collection Time: 12/28/19  8:53 PM  Result Value Ref Range   WBC 11.7 (H) 4.0 - 10.5 K/uL   RBC 4.94 3.87 - 5.11 MIL/uL   Hemoglobin 14.5 12.0 - 15.0 g/dL   HCT 12/30/19 36 - 46 %   MCV 86.4 80.0 - 100.0 fL   MCH 29.4 26.0 - 34.0 pg   MCHC 34.0 30.0 - 36.0 g/dL   RDW 73.5 32.9 - 92.4 %   Platelets 151 150 - 400 K/uL   nRBC 0.0 0.0 - 0.2 %  Respiratory Panel by RT PCR (Flu A&B, Covid) - Nasopharyngeal Swab     Status: None   Collection Time: 12/29/19  2:15 AM   Specimen: Nasopharyngeal Swab  Result Value Ref Range   SARS Coronavirus 2 by RT PCR NEGATIVE NEGATIVE   Influenza A by PCR NEGATIVE NEGATIVE   Influenza B by PCR NEGATIVE NEGATIVE  Urinalysis, Complete w Microscopic     Status: Abnormal   Collection Time: 12/29/19  3:24 AM  Result  Value Ref Range   Color, Urine YELLOW (A) YELLOW   APPearance CLOUDY (A) CLEAR   Specific Gravity, Urine 1.021 1.005 - 1.030   pH 6.0 5.0 - 8.0   Glucose, UA NEGATIVE NEGATIVE mg/dL   Hgb urine dipstick MODERATE (A) NEGATIVE   Bilirubin Urine NEGATIVE NEGATIVE   Ketones, ur NEGATIVE NEGATIVE mg/dL   Protein, ur 30 (A) NEGATIVE mg/dL   Nitrite POSITIVE (A) NEGATIVE   Leukocytes,Ua LARGE (A) NEGATIVE   RBC / HPF 11-20 0 - 5 RBC/hpf   WBC, UA >50 (H) 0 - 5 WBC/hpf   Bacteria, UA FEW (A) NONE SEEN   Squamous Epithelial / LPF 0-5 0 - 5   WBC Clumps PRESENT    Mucus PRESENT     BMET Recent Labs    12/28/19 2053  NA 140  K 4.4  CL 104  CO2 28  GLUCOSE 198*  BUN 22*  CREATININE 1.18*  CALCIUM 10.1   PT/INR No results for input(s): LABPROT, INR in the last 72 hours. ABG No results for input(s): PHART, HCO3 in the last 72 hours.  Invalid input(s): PCO2, PO2  Studies/Results: DG OR UROLOGY CYSTO IMAGE ([facility] ONLY)  Result  Date: 12/29/2019 There is no interpretation for this exam.  This order is for images obtained during a surgical procedure.  Please See "Surgeries" Tab for more information regarding the procedure.   CT Renal Stone Study  Result Date: 12/28/2019 CLINICAL DATA:  Left-sided flank pain history of renal calculi EXAM: CT ABDOMEN AND PELVIS WITHOUT CONTRAST TECHNIQUE: Multidetector CT imaging of the abdomen and pelvis was performed following the standard protocol without IV contrast. COMPARISON:  April 03, 2018 FINDINGS: Lower chest: The visualized heart size within normal limits. No pericardial fluid/thickening. No hiatal hernia. The visualized portions of the lungs are clear. Hepatobiliary: Although limited due to the lack of intravenous contrast, normal in appearance without gross focal abnormality. The patient is status post cholecystectomy. No biliary ductal dilation. Pancreas:  Unremarkable.  No surrounding inflammatory changes. Spleen: Normal in size. Although limited due to the lack of intravenous contrast, normal in appearance. Adrenals/Urinary Tract: Both adrenal glands appear normal. There is moderate left-sided pelvicaliectasis and proximal ureterectasis with a 9 mm calculus within the proximal left ureter. There is left-sided perinephric and periureteral stranding changes. Punctate calcifications seen in the lower pole of the left kidney. There is scattered multiple calcifications seen in the lower pole of the right kidney the largest measuring 3 mm. The bladder is unremarkable. Stomach/Bowel: The stomach, small bowel, and colon are normal in appearance. No inflammatory changes or obstructive findings. A moderate amount colonic stool is present. Vascular/Lymphatic: There are no enlarged abdominal or pelvic lymph nodes. Scattered aortic atherosclerotic calcifications are seen without aneurysmal dilatation. Reproductive: The uterus and adnexa are unremarkable. Other: No evidence of abdominal wall mass or  hernia. Musculoskeletal: No acute or significant osseous findings. Degenerative changes are seen in the thoracolumbar spine with endplate Schmorl's nodes. A overlying nerve stimulator is seen. IMPRESSION: Moderate left hydronephrosis with perinephric stranding changes from a proximal 9 mm ureteral calculus. Multiple punctate bilateral nonobstructing renal calculi Aortic Atherosclerosis (ICD10-I70.0). Electronically Signed   By: Jonna Clark M.D.   On: 12/28/2019 23:29   Stone is 300-500 HU on CT and not well seen on the scout views.   Assessment/Plan: 9mm LUPJ stone with obstruction and UTI with mild AKI.    I will take her to the OR today for cystoscopy and left ureteral stent insertion.  I have reviewed the risks of bleeding, infection, ureteral injury, need for secondary procedures, thrombotic events and anesthetic complications.    I will notify [facility] so they can arrange follow up and ureteroscopy.     CC: Dr. Willy Eddy.      Bjorn Pippin 12/29/2019 [phone]

## 2019-12-30 ENCOUNTER — Encounter: Payer: Self-pay | Admitting: Urology

## 2019-12-30 ENCOUNTER — Inpatient Hospital Stay: Payer: No Typology Code available for payment source

## 2019-12-30 DIAGNOSIS — N132 Hydronephrosis with renal and ureteral calculous obstruction: Secondary | ICD-10-CM

## 2019-12-30 DIAGNOSIS — N10 Acute tubulo-interstitial nephritis: Secondary | ICD-10-CM | POA: Diagnosis not present

## 2019-12-30 DIAGNOSIS — R7301 Impaired fasting glucose: Secondary | ICD-10-CM

## 2019-12-30 DIAGNOSIS — N139 Obstructive and reflux uropathy, unspecified: Secondary | ICD-10-CM | POA: Diagnosis not present

## 2019-12-30 DIAGNOSIS — N1330 Unspecified hydronephrosis: Secondary | ICD-10-CM

## 2019-12-30 DIAGNOSIS — N179 Acute kidney failure, unspecified: Secondary | ICD-10-CM

## 2019-12-30 DIAGNOSIS — R1032 Left lower quadrant pain: Secondary | ICD-10-CM

## 2019-12-30 DIAGNOSIS — E785 Hyperlipidemia, unspecified: Secondary | ICD-10-CM

## 2019-12-30 DIAGNOSIS — N390 Urinary tract infection, site not specified: Secondary | ICD-10-CM

## 2019-12-30 LAB — BASIC METABOLIC PANEL
Anion gap: 7 (ref 5–15)
BUN: 27 mg/dL — ABNORMAL HIGH (ref 6–20)
CO2: 24 mmol/L (ref 22–32)
Calcium: 8.8 mg/dL — ABNORMAL LOW (ref 8.9–10.3)
Chloride: 107 mmol/L (ref 98–111)
Creatinine, Ser: 1.17 mg/dL — ABNORMAL HIGH (ref 0.44–1.00)
GFR, Estimated: 52 mL/min — ABNORMAL LOW (ref >60)
Glucose, Bld: 189 mg/dL — ABNORMAL HIGH (ref 70–99)
Potassium: 4.1 mmol/L (ref 3.5–5.1)
Sodium: 138 mmol/L (ref 135–145)

## 2019-12-30 LAB — CBC
HCT: 36.6 % (ref 36.0–46.0)
Hemoglobin: 12.4 g/dL (ref 12.0–15.0)
MCH: 29.7 pg (ref 26.0–34.0)
MCHC: 33.9 g/dL (ref 30.0–36.0)
MCV: 87.6 fL (ref 80.0–100.0)
Platelets: 116 10*3/uL — ABNORMAL LOW (ref 150–400)
RBC: 4.18 MIL/uL (ref 3.87–5.11)
RDW: 12.8 % (ref 11.5–15.5)
WBC: 12.2 10*3/uL — ABNORMAL HIGH (ref 4.0–10.5)
nRBC: 0 % (ref 0.0–0.2)

## 2019-12-30 LAB — HIV ANTIBODY (ROUTINE TESTING W REFLEX): HIV Screen 4th Generation wRfx: NONREACTIVE

## 2019-12-30 NOTE — Progress Notes (Addendum)
1 Day Post-Op  Subjective: CC: left flank pain.  Hx:  Christina Sawyer is doing well s/p left ureteral stent for a 61mm Left UPJ stone with obstruction and a UTI.  Her pain has improved and she has no voiding complaints.  She remains afebrile.  Her Cr remains slightly elevated at 1.17. Her WBC is up slightly as well.  Urine culture is growing a GNR with sensitivities pending.   She provided more history this morning regarding a "stone passage" 2 months ago.   She was hospitalized in [place] in July for 4 days and had E. Coli sepsis with a pan-sensitive organism and she was found to have a 58mm left UPJ stone.   She underwent cystoscopy with left retrograde and ureteroscopy but the 17mm stone was presumed to have passed but it would be my guess that it was pushed back into the kidney and just not found on subsequent renal ureteroscopy and that the current stone is the same stone.     ROS:  Review of Systems  Constitutional: Negative for chills and fever.  Gastrointestinal: Negative for nausea and vomiting.  All other systems reviewed and are negative.   Anti-infectives: Anti-infectives (From admission, onward)   Start     Dose/Rate Route Frequency Ordered Stop   12/30/19 0000  cefTRIAXone (ROCEPHIN) 1 g in sodium chloride 0.9 % 100 mL IVPB        1 g 200 mL/hr over 30 Minutes Intravenous Every 24 hours 12/29/19 1112     12/29/19 0400  cefTRIAXone (ROCEPHIN) 2 g in sodium chloride 0.9 % 100 mL IVPB        2 g 200 mL/hr over 30 Minutes Intravenous  Once 12/29/19 0349 12/29/19 0507      Current Facility-Administered Medications  Medication Dose Route Frequency Provider Last Rate Last Admin  . 0.9 %  sodium chloride infusion   Intravenous Continuous Bjorn Pippin, MD 125 mL/hr at 12/30/19 0811 New Bag at 12/30/19 0811  . acetaminophen (TYLENOL) tablet 650 mg  650 mg Oral Q6H PRN Bjorn Pippin, MD   650 mg at 12/30/19 4259   Or  . acetaminophen (TYLENOL) suppository 650 mg  650 mg Rectal Q6H PRN  Bjorn Pippin, MD      . atorvastatin (LIPITOR) tablet 40 mg  40 mg Oral QHS Bjorn Pippin, MD   40 mg at 12/29/19 2328  . cefTRIAXone (ROCEPHIN) 1 g in sodium chloride 0.9 % 100 mL IVPB  1 g Intravenous Q24H Bjorn Pippin, MD   Stopped at 12/30/19 0001  . darifenacin (ENABLEX) 24 hr tablet 7.5 mg  7.5 mg Oral Daily Bjorn Pippin, MD   7.5 mg at 12/30/19 0807  . HYDROmorphone (DILAUDID) injection 1 mg  1 mg Intravenous Q4H PRN Bjorn Pippin, MD      . ondansetron [facility]) tablet 4 mg  4 mg Oral Q6H PRN Bjorn Pippin, MD       Or  . ondansetron [facility]) injection 4 mg  4 mg Intravenous Q6H PRN Bjorn Pippin, MD         Objective: Vital signs in last 24 hours: Temp:  [97.7 F (36.5 C)-98.6 F (37 C)] 98 F (36.7 C) (10/17 0805) Pulse Rate:  [58-103] 58 (10/17 0805) Resp:  [12-20] 20 (10/17 0805) BP: (109-148)/(54-98) 144/79 (10/17 0805) SpO2:  [91 %-100 %] 100 % (10/17 0805)  Intake/Output from previous day: 10/16 0701 - 10/17 0700 In: 1100 [I.V.:1000; IV Piggyback:100] Out: 100 [Urine:100] Intake/Output this shift: Total I/O In: -  Out: 300 [Urine:300]   Physical Exam Vitals reviewed.  Constitutional:      Appearance: Normal appearance.  Abdominal:     General: Abdomen is flat.     Palpations: Abdomen is soft.     Tenderness: There is no abdominal tenderness.  Neurological:     Mental Status: She is alert.     Lab Results:  Recent Labs    12/28/19 2053 12/30/19 0459  WBC 11.7* 12.2*  HGB 14.5 12.4  HCT 42.7 36.6  PLT 151 116*   BMET Recent Labs    12/28/19 2053 12/30/19 0459  NA 140 138  K 4.4 4.1  CL 104 107  CO2 28 24  GLUCOSE 198* 189*  BUN 22* 27*  CREATININE 1.18* 1.17*  CALCIUM 10.1 8.8*   PT/INR No results for input(s): LABPROT, INR in the last 72 hours. ABG No results for input(s): PHART, HCO3 in the last 72 hours.  Invalid input(s): PCO2, PO2  Studies/Results: DG OR UROLOGY CYSTO IMAGE ([facility] ONLY)  Result Date: 12/29/2019 There is no  interpretation for this exam.  This order is for images obtained during a surgical procedure.  Please See "Surgeries" Tab for more information regarding the procedure.   CT Renal Stone Study  Result Date: 12/28/2019 CLINICAL DATA:  Left-sided flank pain history of renal calculi EXAM: CT ABDOMEN AND PELVIS WITHOUT CONTRAST TECHNIQUE: Multidetector CT imaging of the abdomen and pelvis was performed following the standard protocol without IV contrast. COMPARISON:  April 03, 2018 FINDINGS: Lower chest: The visualized heart size within normal limits. No pericardial fluid/thickening. No hiatal hernia. The visualized portions of the lungs are clear. Hepatobiliary: Although limited due to the lack of intravenous contrast, normal in appearance without gross focal abnormality. The patient is status post cholecystectomy. No biliary ductal dilation. Pancreas:  Unremarkable.  No surrounding inflammatory changes. Spleen: Normal in size. Although limited due to the lack of intravenous contrast, normal in appearance. Adrenals/Urinary Tract: Both adrenal glands appear normal. There is moderate left-sided pelvicaliectasis and proximal ureterectasis with a 9 mm calculus within the proximal left ureter. There is left-sided perinephric and periureteral stranding changes. Punctate calcifications seen in the lower pole of the left kidney. There is scattered multiple calcifications seen in the lower pole of the right kidney the largest measuring 3 mm. The bladder is unremarkable. Stomach/Bowel: The stomach, small bowel, and colon are normal in appearance. No inflammatory changes or obstructive findings. A moderate amount colonic stool is present. Vascular/Lymphatic: There are no enlarged abdominal or pelvic lymph nodes. Scattered aortic atherosclerotic calcifications are seen without aneurysmal dilatation. Reproductive: The uterus and adnexa are unremarkable. Other: No evidence of abdominal wall mass or hernia. Musculoskeletal: No  acute or significant osseous findings. Degenerative changes are seen in the thoracolumbar spine with endplate Schmorl's nodes. A overlying nerve stimulator is seen. IMPRESSION: Moderate left hydronephrosis with perinephric stranding changes from a proximal 9 mm ureteral calculus. Multiple punctate bilateral nonobstructing renal calculi Aortic Atherosclerosis (ICD10-I70.0). Electronically Signed   By: Jonna Clark M.D.   On: 12/28/2019 23:29   I have retrieved and reviewed records from [facility]   I have reviewed her hospital notes, Op note, CT report and labs.  She had pan-sensitive e. Coli in the blood and urine at that time.   Assessment and Plan: 44mm LUPJ stone with infection.   She is doing better with reduced pain s/p stenting.  She remains afebrile on Ceftriaxone and her preliminary culture is growing GNR.  I would continue IV antibiotics pending the culture and then she will need coverage based on sensitivities until she has had definitive stone management.     She has some interest in ESWL so I will get a KUB today to assess the visibility of the stone on imaging.     She has an appointment with her Urologist at the [place] on 01/01/20.  I had requested f/u with [facility] but that plan may need to change based [facility].    AKI.  Her Cr has not declined s/p stenting which is suggestive of an acute renal injury from the infection.  Her Cr was 0.94 on 10/11/19 and is now 1.17.    Addendum:  KUB today shows the stone is visible at the stent loop.  ESWL could be consider if that is what the patient desires.     LOS: 1 day    Bjorn Pippin 12/30/2019 [phone] ID: Catarina Hartshorn, female   DOB: 28-Oct-1960, 59 y.o.   MRN: [id number]

## 2019-12-30 NOTE — Progress Notes (Signed)
Patient ID: Christina Sawyer, female   DOB: 08-Sep-1960, 59 y.o.   MRN: [id number] [facility] PROGRESS NOTE  Eryka Dolinger QIH:[id number] DOB: 1960-06-29 DOA: 12/29/2019 PCP: Concha Pyo, MD  HPI/Subjective: Patient feeling better today than yesterday but still having some pain on her left side of her abdomen.  Eating breakfast this morning.  No nausea or vomiting.  Still seeing some blood in the urine.  Objective: Vitals:   12/30/19 0805 12/30/19 1131  BP: (!) 144/79 129/81  Pulse: (!) 58 64  Resp: 20 16  Temp: 98 F (36.7 C) 98 F (36.7 C)  SpO2: 100% 95%    Intake/Output Summary (Last 24 hours) at 12/30/2019 1306 Last data filed at 12/30/2019 0811 Gross per 24 hour  Intake 200 ml  Output 400 ml  Net -200 ml   Filed Weights   12/28/19 2050  Weight: 71.7 kg    ROS: Review of Systems  Respiratory: Negative for cough and shortness of breath.   Cardiovascular: Negative for chest pain.  Gastrointestinal: Positive for abdominal pain. Negative for nausea and vomiting.  Genitourinary: Positive for flank pain and hematuria.   Exam: Physical Exam HENT:     Head: Normocephalic.     Mouth/Throat:     Pharynx: No oropharyngeal exudate.  Eyes:     General: Lids are normal.     Conjunctiva/sclera: Conjunctivae normal.     Pupils: Pupils are equal, round, and reactive to light.  Cardiovascular:     Rate and Rhythm: Normal rate and regular rhythm.     Heart sounds: Normal heart sounds, S1 normal and S2 normal.  Pulmonary:     Breath sounds: No decreased breath sounds, wheezing, rhonchi or rales.  Abdominal:     Palpations: Abdomen is soft.     Tenderness: There is no abdominal tenderness.  Musculoskeletal:     Right lower leg: No swelling.     Left lower leg: No swelling.  Skin:    General: Skin is warm.     Findings: No rash.  Neurological:     Mental Status: She is alert and oriented to person, place, and time.       Data Reviewed: Basic Metabolic Panel: Recent  Labs  Lab 12/28/19 2053 12/30/19 0459  NA 140 138  K 4.4 4.1  CL 104 107  CO2 28 24  GLUCOSE 198* 189*  BUN 22* 27*  CREATININE 1.18* 1.17*  CALCIUM 10.1 8.8*   Liver Function Tests: Recent Labs  Lab 12/28/19 2053  AST 21  ALT 24  ALKPHOS 118  BILITOT 1.0  PROT 7.9  ALBUMIN 4.4   CBC: Recent Labs  Lab 12/28/19 2053 12/30/19 0459  WBC 11.7* 12.2*  HGB 14.5 12.4  HCT 42.7 36.6  MCV 86.4 87.6  PLT 151 116*     Recent Results (from the past 240 hour(s))  Respiratory Panel by RT PCR (Flu A&B, Covid) - Nasopharyngeal Swab     Status: None   Collection Time: 12/29/19  2:15 AM   Specimen: Nasopharyngeal Swab  Result Value Ref Range Status   SARS Coronavirus 2 by RT PCR NEGATIVE NEGATIVE Final    Comment: (NOTE) SARS-CoV-2 target nucleic acids are NOT DETECTED.  The SARS-CoV-2 RNA is generally detectable in upper respiratoy specimens during the acute phase of infection. The lowest concentration of SARS-CoV-2 viral copies this assay can detect is 131 copies/mL. A negative result does not preclude SARS-Cov-2 infection and should not be used as the sole basis for treatment or  other patient management decisions. A negative result may occur with  improper specimen collection/handling, submission of specimen other than nasopharyngeal swab, presence of viral mutation(s) within the areas targeted by this assay, and inadequate number of viral copies (<131 copies/mL). A negative result must be combined with clinical observations, patient history, and epidemiological information. The expected result is Negative.  Fact Sheet for Patients:  https://www.moore.com/https://www.fda.gov/media/142436/download  Fact Sheet for Healthcare Providers:  https://www.young.biz/https://www.fda.gov/media/142435/download  This test is no t yet approved or cleared by the [place] FDA and  has been authorized for detection and/or diagnosis of SARS-CoV-2 by FDA under an Emergency Use Authorization (EUA). This EUA will remain  in  effect (meaning this test can be used) for the duration of the COVID-19 declaration under Section 564(b)(1) of the Act, 21 U.S.C. section 360bbb-3(b)(1), unless the authorization is terminated or revoked sooner.     Influenza A by PCR NEGATIVE NEGATIVE Final   Influenza B by PCR NEGATIVE NEGATIVE Final    Comment: (NOTE) The Xpert Xpress SARS-CoV-2/FLU/RSV assay is intended as an aid in  the diagnosis of influenza from Nasopharyngeal swab specimens and  should not be used as a sole basis for treatment. Nasal washings and  aspirates are unacceptable for Xpert Xpress SARS-CoV-2/FLU/RSV  testing.  Fact Sheet for Patients: https://www.moore.com/https://www.fda.gov/media/142436/download  Fact Sheet for Healthcare Providers: https://www.young.biz/https://www.fda.gov/media/142435/download  This test is not yet approved or cleared by the [place] FDA and  has been authorized for detection and/or diagnosis of SARS-CoV-2 by  FDA under an Emergency Use Authorization (EUA). This EUA will remain  in effect (meaning this test can be used) for the duration of the  Covid-19 declaration under Section 564(b)(1) of the Act, 21  U.S.C. section 360bbb-3(b)(1), unless the authorization is  terminated or revoked. Performed at [facility], [address] [place], [place] [address]   Urine Culture     Status: Abnormal (Preliminary result)   Collection Time: 12/29/19  3:24 AM   Specimen: Urine, Random  Result Value Ref Range Status   Specimen Description   Final    URINE, RANDOM Performed at [facility], [address] [place], [place] [address]    Special Requests   Final    NONE Performed at [facility], [address] [place], [place] [address]    Culture (A)  Final    >=100,000 COLONIES/mL ESCHERICHIA COLI SUSCEPTIBILITIES TO FOLLOW Performed at [facility], [address] [place], [place] [address]    Report Status PENDING  Incomplete  Urine Culture     Status: None (Preliminary  result)   Collection Time: 12/29/19  7:16 PM   Specimen: Urine, Random  Result Value Ref Range Status   Specimen Description   Final    URINE, RANDOM Performed at [facility], [address] [place], [place] [address]    Special Requests   Final    LEFT RENAL PELVIS Performed at [facility], [address] [place], [place] [address]    Culture PENDING  Incomplete   Report Status PENDING  Incomplete     Studies: DG OR UROLOGY CYSTO IMAGE ([facility] ONLY)  Result Date: 12/29/2019 There is no interpretation for this exam.  This order is for images obtained during a surgical procedure.  Please See "Surgeries" Tab for more information regarding the procedure.   CT Renal Stone Study  Result Date: 12/28/2019 CLINICAL DATA:  Left-sided flank pain history of renal calculi EXAM: CT ABDOMEN AND PELVIS WITHOUT  CONTRAST TECHNIQUE: Multidetector CT imaging of the abdomen and pelvis was performed following the standard protocol without IV contrast. COMPARISON:  April 03, 2018 FINDINGS: Lower chest: The visualized heart size within normal limits. No pericardial fluid/thickening. No hiatal hernia. The visualized portions of the lungs are clear. Hepatobiliary: Although limited due to the lack of intravenous contrast, normal in appearance without gross focal abnormality. The patient is status post cholecystectomy. No biliary ductal dilation. Pancreas:  Unremarkable.  No surrounding inflammatory changes. Spleen: Normal in size. Although limited due to the lack of intravenous contrast, normal in appearance. Adrenals/Urinary Tract: Both adrenal glands appear normal. There is moderate left-sided pelvicaliectasis and proximal ureterectasis with a 9 mm calculus within the proximal left ureter. There is left-sided perinephric and periureteral stranding changes. Punctate calcifications seen in the lower pole of the left kidney. There is scattered multiple calcifications seen in the lower pole of the  right kidney the largest measuring 3 mm. The bladder is unremarkable. Stomach/Bowel: The stomach, small bowel, and colon are normal in appearance. No inflammatory changes or obstructive findings. A moderate amount colonic stool is present. Vascular/Lymphatic: There are no enlarged abdominal or pelvic lymph nodes. Scattered aortic atherosclerotic calcifications are seen without aneurysmal dilatation. Reproductive: The uterus and adnexa are unremarkable. Other: No evidence of abdominal wall mass or hernia. Musculoskeletal: No acute or significant osseous findings. Degenerative changes are seen in the thoracolumbar spine with endplate Schmorl's nodes. A overlying nerve stimulator is seen. IMPRESSION: Moderate left hydronephrosis with perinephric stranding changes from a proximal 9 mm ureteral calculus. Multiple punctate bilateral nonobstructing renal calculi Aortic Atherosclerosis (ICD10-I70.0). Electronically Signed   By: Jonna Clark M.D.   On: 12/28/2019 23:29    Scheduled Meds:  atorvastatin  40 mg Oral QHS   darifenacin  7.5 mg Oral Daily   Continuous Infusions:  sodium chloride 125 mL/hr at 12/30/19 0811   cefTRIAXone (ROCEPHIN)  IV Stopped (12/30/19 0001)    Assessment/Plan:  1. Acute obstructive uropathy with stone and left hydronephrosis.  Had stent procedure done by urology yesterday.  Urology wants to watch again overnight and get an x-ray today.  IV fluid hydration. 2. Acute pyelonephritis.  Urine culture growing gram-negative rods on IV Rocephin.  Would like to see the identification and sensitivities prior to disposition. 3. Hyperlipidemia unspecified on atorvastatin 4. Impaired fasting glucose and on a hemoglobin A1c.    Code Status:     Code Status Orders  (From admission, onward)         Start     Ordered   12/29/19 1110  Full code  Continuous        12/29/19 1110        Code Status History    Date Active Date Inactive Code Status Order ID Comments User Context    04/14/2015 1536 04/16/2015 2118 Full Code [id number]  Rolly Salter, MD ED   Advance Care Planning Activity     Family Communication: Left message for son Jonny Ruiz on the phone. Disposition Plan: Status is: Inpatient  Dispo: The patient is from: Home              Anticipated d/c is to: Home              Anticipated d/c date is: Potential 12/31/2019              Patient currently being treated for obstructive uropathy with kidney stone status post stenting procedure by urology and acute pyelonephritis requiring IV antibiotics.  Consultants:  Urology  Procedures:  Stent left ureter  Antibiotics:  Rocephin  Time spent: 28 minutes  [employer]ahmi[employer]

## 2019-12-31 ENCOUNTER — Telehealth: Payer: Self-pay | Admitting: Urology

## 2019-12-31 DIAGNOSIS — N1330 Unspecified hydronephrosis: Secondary | ICD-10-CM | POA: Diagnosis not present

## 2019-12-31 DIAGNOSIS — N200 Calculus of kidney: Secondary | ICD-10-CM

## 2019-12-31 DIAGNOSIS — E785 Hyperlipidemia, unspecified: Secondary | ICD-10-CM | POA: Diagnosis not present

## 2019-12-31 DIAGNOSIS — N139 Obstructive and reflux uropathy, unspecified: Secondary | ICD-10-CM | POA: Diagnosis not present

## 2019-12-31 DIAGNOSIS — N10 Acute tubulo-interstitial nephritis: Secondary | ICD-10-CM | POA: Diagnosis not present

## 2019-12-31 LAB — CBC
HCT: 36 % (ref 36.0–46.0)
Hemoglobin: 11.9 g/dL — ABNORMAL LOW (ref 12.0–15.0)
MCH: 29.4 pg (ref 26.0–34.0)
MCHC: 33.1 g/dL (ref 30.0–36.0)
MCV: 88.9 fL (ref 80.0–100.0)
Platelets: 120 10*3/uL — ABNORMAL LOW (ref 150–400)
RBC: 4.05 MIL/uL (ref 3.87–5.11)
RDW: 12.7 % (ref 11.5–15.5)
WBC: 9.3 10*3/uL (ref 4.0–10.5)
nRBC: 0 % (ref 0.0–0.2)

## 2019-12-31 LAB — BASIC METABOLIC PANEL
Anion gap: 8 (ref 5–15)
BUN: 24 mg/dL — ABNORMAL HIGH (ref 6–20)
CO2: 25 mmol/L (ref 22–32)
Calcium: 9 mg/dL (ref 8.9–10.3)
Chloride: 103 mmol/L (ref 98–111)
Creatinine, Ser: 1.06 mg/dL — ABNORMAL HIGH (ref 0.44–1.00)
GFR, Estimated: 58 mL/min — ABNORMAL LOW (ref >60)
Glucose, Bld: 112 mg/dL — ABNORMAL HIGH (ref 70–99)
Potassium: 3.7 mmol/L (ref 3.5–5.1)
Sodium: 136 mmol/L (ref 135–145)

## 2019-12-31 LAB — LIPID PANEL
Cholesterol: 223 mg/dL — ABNORMAL HIGH (ref 0–200)
HDL: 52 mg/dL (ref >40)
LDL Cholesterol: 148 mg/dL — ABNORMAL HIGH (ref 0–99)
Total CHOL/HDL Ratio: 4.3 RATIO
Triglycerides: 113 mg/dL (ref <150)
VLDL: 23 mg/dL (ref 0–40)

## 2019-12-31 LAB — URINE CULTURE: Culture: >=100000 — AB

## 2019-12-31 LAB — HEMOGLOBIN A1C
Hgb A1c MFr Bld: 6 % — ABNORMAL HIGH (ref 4.8–5.6)
Mean Plasma Glucose: 126 mg/dL

## 2019-12-31 MED ORDER — TAMSULOSIN HCL 0.4 MG PO CAPS: 0.4000 mg | ORAL_CAPSULE | Freq: Every day | ORAL | 0 refills | Status: DC

## 2019-12-31 MED ORDER — ONDANSETRON HCL 4 MG PO TABS: 4.0000 mg | ORAL_TABLET | Freq: Four times a day (QID) | ORAL | 0 refills | Status: DC | PRN

## 2019-12-31 MED ORDER — TAMSULOSIN HCL 0.4 MG PO CAPS
0.4000 mg | ORAL_CAPSULE | Freq: Every day | ORAL | 0 refills | Status: DC
Start: 2019-12-31 — End: 2020-02-13

## 2019-12-31 MED ORDER — OXYCODONE HCL 5 MG PO TABS
5.0000 mg | ORAL_TABLET | Freq: Four times a day (QID) | ORAL | 0 refills | Status: DC | PRN
Start: 2019-12-31 — End: 2019-12-31

## 2019-12-31 MED ORDER — CEPHALEXIN 500 MG PO CAPS: 500.0000 mg | ORAL_CAPSULE | Freq: Three times a day (TID) | ORAL | 0 refills | Status: DC

## 2019-12-31 MED ORDER — CEPHALEXIN 500 MG PO CAPS: 500.0000 mg | ORAL_CAPSULE | Freq: Three times a day (TID) | ORAL | 0 refills | Status: AC

## 2019-12-31 MED ORDER — OXYCODONE HCL 5 MG PO TABS
5.0000 mg | ORAL_TABLET | Freq: Four times a day (QID) | ORAL | 0 refills | Status: DC | PRN
Start: 2019-12-31 — End: 2020-02-13

## 2019-12-31 MED ORDER — OXYCODONE HCL 5 MG PO TABS
5.0000 mg | ORAL_TABLET | Freq: Four times a day (QID) | ORAL | Status: DC | PRN
  Administered 2019-12-31: 5 mg via ORAL
  Filled 2019-12-31: qty 1

## 2019-12-31 MED ORDER — TAMSULOSIN HCL 0.4 MG PO CAPS
0.4000 mg | ORAL_CAPSULE | Freq: Every day | ORAL | 0 refills | Status: DC
Start: 2019-12-31 — End: 2019-12-31

## 2019-12-31 NOTE — Discharge Summary (Signed)
[facility] [place] [facility]   PATIENT NAME: Christina Sawyer    MR#:  [id number]  DATE OF BIRTH:  1960/06/12  DATE OF ADMISSION:  12/29/2019 ADMITTING PHYSICIAN: Lucile Shutters, MD  DATE OF DISCHARGE: 12/31/2019  PRIMARY CARE PHYSICIAN: Concha Pyo, MD    ADMISSION DIAGNOSIS:  Ureterolithiasis [N20.1] Nephrolithiasis [N20.0] Cystitis [N30.90] Obstruction of left ureter [N13.5]  DISCHARGE DIAGNOSIS:  Principal Problem:   Acute unilateral obstructive uropathy Active Problems:   Hydronephrosis of left kidney   Acute pyelonephritis   Hyperlipidemia   Impaired fasting glucose   SECONDARY DIAGNOSIS:   Past Medical History:  Diagnosis Date  . Bladder incontinence    "interstim implant"- Right flank  . Complication of anesthesia    "Stadol caused over sedation"  . Hypercholesteremia   . Kidney stone    at present-has stent in place.  Marland Kitchen PONV (postoperative nausea and vomiting)   . 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  Acute obstructive uropathy with stone and left hydronephrosis.  Patient had urgent stent placement by Dr. Annabell Howells urology on 12/29/2019.  The patient will follow up with her urologist at the [facility] and get permission to follow-up with [facility] to handle the stone and eventually remove the stent.  Flomax prescribed.  Strain urine. 2.  Acute pyelonephritis.  Urine culture growing pansensitive E. coli.  Was given IV Rocephin here will switch over to Keflex for 12 more days.  Would like to keep antibiotic going until stone handled. 3.  Hyperlipidemia unspecified on atorvastatin 4.  Impaired fasting glucose.  Hemoglobin A1c 6.0.  Patient not a diabetic.  DISCHARGE CONDITIONS:   Satisfactory  CONSULTS OBTAINED:  Urology  DRUG ALLERGIES:   Allergies  Allergen Reactions  . Butorphanol Other (See Comments)    "goes under""over sedation" Sensitivity     DISCHARGE  MEDICATIONS:   Allergies as of 12/31/2019      Reactions   Butorphanol Other (See Comments)   "goes under""over sedation" Sensitivity      Medication List    STOP taking these medications   conjugated estrogens vaginal cream Commonly known as: PREMARIN   ibuprofen 200 MG tablet Commonly known as: ADVIL     TAKE these medications   atorvastatin 40 MG tablet Commonly known as: LIPITOR Take 40 mg by mouth at bedtime.   cephALEXin 500 MG capsule Commonly known as: KEFLEX Take 1 capsule (500 mg total) by mouth 3 (three) times daily for 12 days.   ondansetron 4 MG tablet Commonly known as: ZOFRAN Take 1 tablet (4 mg total) by mouth every 6 (six) hours as needed for nausea.   oxyCODONE 5 MG immediate release tablet Commonly known as: Oxy IR/ROXICODONE Take 1 tablet (5 mg total) by mouth every 6 (six) hours as needed for severe pain.   solifenacin 10 MG tablet Commonly known as: VESICARE Take 10 mg by mouth daily as needed. Take 2 hours prior to bedtime   tamsulosin 0.4 MG Caps capsule Commonly known as: Flomax Take 1 capsule (0.4 mg total) by mouth daily after supper.        DISCHARGE INSTRUCTIONS:   Follow-up PMD 5 days [facility] keep that appointment [facility] will call you for follow-up appointment.  If you experience worsening of your admission symptoms, develop shortness of breath, life threatening emergency, suicidal or homicidal thoughts you must seek medical attention immediately  by calling 911 or calling your MD immediately  if symptoms less severe.  You Must read complete instructions/literature along with all the possible adverse reactions/side effects for all the Medicines you take and that have been prescribed to you. Take any new Medicines after you have completely understood and accept all the possible adverse reactions/side effects.   Please note  You were cared for by a  hospitalist during your hospital stay. If you have any questions about your discharge medications or the care you received while you were in the hospital after you are discharged, you can call the unit and asked to speak with the hospitalist on call if the hospitalist that took care of you is not available. Once you are discharged, your primary care physician will handle any further medical issues. Please note that NO REFILLS for any discharge medications will be authorized once you are discharged, as it is imperative that you return to your primary care physician (or establish a relationship with a primary care physician if you do not have one) for your aftercare needs so that they can reassess your need for medications and monitor your lab values.    Today   CHIEF COMPLAINT:   Chief Complaint  Patient presents with  . Flank Pain    HISTORY OF PRESENT ILLNESS:  Christina Sawyer  is a 59 y.o. female came in with flank pain   VITAL SIGNS:  Blood pressure (!) 152/75, pulse 63, temperature 99.5 F (37.5 C), temperature source Oral, resp. rate 20, height 5\' 6"  (1.676 m), weight 71.7 kg, SpO2 98 %.  I/O:    Intake/Output Summary (Last 24 hours) at 12/31/2019 1525 Last data filed at 12/31/2019 1300 Gross per 24 hour  Intake 2722.43 ml  Output 1500 ml  Net 1222.43 ml    PHYSICAL EXAMINATION:  GENERAL:  59 y.o.-year-old patient lying in the bed with no acute distress.  EYES: Pupils equal, round, reactive to light and accommodation. No scleral icterus HEENT: Head atraumatic, normocephalic. Oropharynx and nasopharynx clear.   LUNGS: Normal breath sounds bilaterally, no wheezing, rales,rhonchi or crepitation. No use of accessory muscles of respiration.  CARDIOVASCULAR: S1, S2 normal. No murmurs, rubs, or gallops.  ABDOMEN: Soft, non-tender, non-distended.  EXTREMITIES: No pedal edema.  NEUROLOGIC: Cranial nerves II through XII are intact. Muscle strength 5/5 in all extremities. Sensation  intact. Gait not checked.  PSYCHIATRIC: The patient is alert and oriented x 3.  SKIN: No obvious rash, lesion, or ulcer.   DATA REVIEW:   CBC Recent Labs  Lab 12/31/19 0739  WBC 9.3  HGB 11.9*  HCT 36.0  PLT 120*    Chemistries  Recent Labs  Lab 12/28/19 2053 12/30/19 0459 12/31/19 0739  NA 140   < > 136  K 4.4   < > 3.7  CL 104   < > 103  CO2 28   < > 25  GLUCOSE 198*   < > 112*  BUN 22*   < > 24*  CREATININE 1.18*   < > 1.06*  CALCIUM 10.1   < > 9.0  AST 21  --   --   ALT 24  --   --   ALKPHOS 118  --   --   BILITOT 1.0  --   --    < > = values in this interval not displayed.    Microbiology Results  Results for orders placed or performed during the hospital encounter of 12/29/19  Respiratory Panel by RT PCR (  Flu A&B, Covid) - Nasopharyngeal Swab     Status: None   Collection Time: 12/29/19  2:15 AM   Specimen: Nasopharyngeal Swab  Result Value Ref Range Status   SARS Coronavirus 2 by RT PCR NEGATIVE NEGATIVE Final    Comment: (NOTE) SARS-CoV-2 target nucleic acids are NOT DETECTED.  The SARS-CoV-2 RNA is generally detectable in upper respiratoy specimens during the acute phase of infection. The lowest concentration of SARS-CoV-2 viral copies this assay can detect is 131 copies/mL. A negative result does not preclude SARS-Cov-2 infection and should not be used as the sole basis for treatment or other patient management decisions. A negative result may occur with  improper specimen collection/handling, submission of specimen other than nasopharyngeal swab, presence of viral mutation(s) within the areas targeted by this assay, and inadequate number of viral copies (<131 copies/mL). A negative result must be combined with clinical observations, patient history, and epidemiological information. The expected result is Negative.  Fact Sheet for Patients:  https://www.moore.com/  Fact Sheet for Healthcare Providers:   https://www.young.biz/  This test is no t yet approved or cleared by the [place] FDA and  has been authorized for detection and/or diagnosis of SARS-CoV-2 by FDA under an Emergency Use Authorization (EUA). This EUA will remain  in effect (meaning this test can be used) for the duration of the COVID-19 declaration under Section 564(b)(1) of the Act, 21 U.S.C. section 360bbb-3(b)(1), unless the authorization is terminated or revoked sooner.     Influenza A by PCR NEGATIVE NEGATIVE Final   Influenza B by PCR NEGATIVE NEGATIVE Final    Comment: (NOTE) The Xpert Xpress SARS-CoV-2/FLU/RSV assay is intended as an aid in  the diagnosis of influenza from Nasopharyngeal swab specimens and  should not be used as a sole basis for treatment. Nasal washings and  aspirates are unacceptable for Xpert Xpress SARS-CoV-2/FLU/RSV  testing.  Fact Sheet for Patients: https://www.moore.com/  Fact Sheet for Healthcare Providers: https://www.young.biz/  This test is not yet approved or cleared by the [place] FDA and  has been authorized for detection and/or diagnosis of SARS-CoV-2 by  FDA under an Emergency Use Authorization (EUA). This EUA will remain  in effect (meaning this test can be used) for the duration of the  Covid-19 declaration under Section 564(b)(1) of the Act, 21  U.S.C. section 360bbb-3(b)(1), unless the authorization is  terminated or revoked. Performed at [facility], [address], [place], [place] [address]   Urine Culture     Status: Abnormal   Collection Time: 12/29/19  3:24 AM   Specimen: Urine, Random  Result Value Ref Range Status   Specimen Description   Final    URINE, RANDOM Performed at [facility], [address] [place], [place] [address]    Special Requests   Final    NONE Performed at [facility], [address] [place], [place] [address]    Culture  >=100,000 COLONIES/mL ESCHERICHIA COLI (A)  Final   Report Status 12/31/2019 FINAL  Final   Organism ID, Bacteria ESCHERICHIA COLI (A)  Final      Susceptibility   Escherichia coli - MIC*    AMPICILLIN <=2 SENSITIVE Sensitive     CEFAZOLIN <=4 SENSITIVE Sensitive     CEFTRIAXONE <=0.25 SENSITIVE Sensitive     CIPROFLOXACIN <=0.25 SENSITIVE Sensitive     GENTAMICIN <=1 SENSITIVE Sensitive     IMIPENEM <=0.25 SENSITIVE Sensitive     NITROFURANTOIN <=16 SENSITIVE Sensitive     TRIMETH/SULFA <=  20 SENSITIVE Sensitive     AMPICILLIN/SULBACTAM <=2 SENSITIVE Sensitive     PIP/TAZO <=4 SENSITIVE Sensitive     * >=100,000 COLONIES/mL ESCHERICHIA COLI    RADIOLOGY:  DG Abd 1 View  Result Date: 12/30/2019 CLINICAL DATA:  Left kidney stone. EXAM: ABDOMEN - 1 VIEW COMPARISON:  June 23, 2015 FINDINGS: The right kidney is partially obscured by bowel contents but there appear be stones in the lower pole, correlating with a CT scan from yesterday. The left kidney is also largely obscured by bowel contents. A ureteral stent has been placed in the interval. The distal aspect of the stent is below today's film. The proximal aspect of the stent is likely in the lower left renal pelvis. There appear to be calcifications adjacent to the proximal pigtail suggestive of stones. IMPRESSION: 1. A left ureteral stent is been placed in the interval. The distal aspect is not included on today's study. Calcifications adjacent to the proximal pigtail are likely stones in the pelvis or proximal ureter. 2. At least 1 stone is seen in the lower pole the right kidney. Electronically Signed   By: Gerome Sam III M.D   On: 12/30/2019 14:25    Management plans discussed with the patient, and she is in agreement.  CODE STATUS:     Code Status Orders  (From admission, onward)         Start     Ordered   12/29/19 1110  Full code  Continuous        12/29/19 1110        Code Status History    Date Active Date  Inactive Code Status Order ID Comments User Context   04/14/2015 1536 04/16/2015 2118 Full Code [id number]  Rolly Salter, MD ED   Advance Care Planning Activity      TOTAL TIME TAKING CARE OF THIS PATIENT: 35 minutes.    Alford Highland M.D on 12/31/2019 at 3:25 PM  Between 7am to 6pm - Pager - [phone]  After 6pm go to [email or web address] - password [email or web address]  [facility]  CC: Primary care physician; Concha Pyo, MD

## 2019-12-31 NOTE — Telephone Encounter (Signed)
App made patient wanted to be seen this week for possible litho  She does need [facility] approval first, She has an app with them on 01-01-20  [facility]

## 2019-12-31 NOTE — Telephone Encounter (Signed)
-----   Message from Bjorn Pippin, MD sent at 12/29/2019 11:43 AM EDT ----- I put a left stent in [facility] for a 51mm stone at the left UPJ with infection.   She will need Ureteroscopy in next week or so.

## 2019-12-31 NOTE — Progress Notes (Addendum)
Urology Inpatient Progress Note  Subjective: Afebrile, VSS.  No acute events overnight Creatinine down today, 1.06. Patient reports generalized abdominal pain without flank pain, dysuria, or gross hematuria. Urine culture finalized with pansensitive E. coli, and antibiotics as below.  Anti-infectives: Anti-infectives (From admission, onward)   Start     Dose/Rate Route Frequency Ordered Stop   12/30/19 0000  cefTRIAXone (ROCEPHIN) 1 g in sodium chloride 0.9 % 100 mL IVPB        1 g 200 mL/hr over 30 Minutes Intravenous Every 24 hours 12/29/19 1112     12/29/19 0400  cefTRIAXone (ROCEPHIN) 2 g in sodium chloride 0.9 % 100 mL IVPB        2 g 200 mL/hr over 30 Minutes Intravenous  Once 12/29/19 0349 12/29/19 0507      Current Facility-Administered Medications  Medication Dose Route Frequency Provider Last Rate Last Admin  . 0.9 %  sodium chloride infusion   Intravenous Continuous Alford Highland, MD 75 mL/hr at 12/31/19 0506 Rate Verify at 12/31/19 0506  . acetaminophen (TYLENOL) tablet 650 mg  650 mg Oral Q6H PRN Bjorn Pippin, MD   650 mg at 12/30/19 3704   Or  . acetaminophen (TYLENOL) suppository 650 mg  650 mg Rectal Q6H PRN Bjorn Pippin, MD      . atorvastatin (LIPITOR) tablet 40 mg  40 mg Oral QHS Bjorn Pippin, MD   40 mg at 12/30/19 2245  . cefTRIAXone (ROCEPHIN) 1 g in sodium chloride 0.9 % 100 mL IVPB  1 g Intravenous Q24H Bjorn Pippin, MD   Stopped at 12/30/19 2320  . darifenacin (ENABLEX) 24 hr tablet 7.5 mg  7.5 mg Oral Daily Bjorn Pippin, MD   7.5 mg at 12/30/19 0807  . HYDROmorphone (DILAUDID) injection 1 mg  1 mg Intravenous Q4H PRN Bjorn Pippin, MD   1 mg at 12/30/19 1347  . ondansetron (ZOFRAN) tablet 4 mg  4 mg Oral Q6H PRN Bjorn Pippin, MD       Or  . ondansetron [facility]) injection 4 mg  4 mg Intravenous Q6H PRN Bjorn Pippin, MD   4 mg at 12/30/19 1827   Objective: Vital signs in last 24 hours: Temp:  [97.3 F (36.3 C)-99.5 F (37.5 C)] 99.5 F (37.5 C) (10/18  0734) Pulse Rate:  [60-74] 63 (10/18 0734) Resp:  [16-20] 20 (10/18 0734) BP: (115-152)/(64-81) 152/75 (10/18 0734) SpO2:  [95 %-100 %] 98 % (10/18 0734)  Intake/Output from previous day: 10/17 0701 - 10/18 0700 In: 2542.4 [I.V.:2442.4; IV Piggyback:100] Out: 1800 [Urine:1800] Intake/Output this shift: No intake/output data recorded.  Physical Exam Vitals and nursing note reviewed.  Constitutional:      General: She is not in acute distress.    Appearance: She is not ill-appearing, toxic-appearing or diaphoretic.  HENT:     Head: Normocephalic and atraumatic.  Pulmonary:     Effort: Pulmonary effort is normal. No respiratory distress.  Skin:    General: Skin is warm and dry.  Neurological:     Mental Status: She is alert and oriented to person, place, and time.  Psychiatric:        Mood and Affect: Mood normal.        Behavior: Behavior normal.    Lab Results:  Recent Labs    12/30/19 0459 12/31/19 0739  WBC 12.2* 9.3  HGB 12.4 11.9*  HCT 36.6 36.0  PLT 116* 120*   BMET Recent Labs    12/30/19 0459 12/31/19 0739  NA 138  136  K 4.1 3.7  CL 107 103  CO2 24 25  GLUCOSE 189* 112*  BUN 27* 24*  CREATININE 1.17* 1.06*  CALCIUM 8.8* 9.0   Assessment & Plan: 59 year old female POD 2 from left ureteral stent placement with Dr. Apolinar Junes for management of a 9 mm left UPJ stone with obstruction and infection.  She is tolerating the stent well today and reports feeling well.  We discussed typical stent symptoms today including gross hematuria, flank pain, dysuria, urgency, and frequency.  She appears to be having none of these.  We discussed her definitive stone management treatment options moving forward.  I offered her ESWL this week versus ureteroscopy with laser lithotripsy and stent exchange.  We discussed the risks and benefits of these procedures including prolonged ureteral stent and instrumentation with ureteroscopy versus minimal instrumentation with subsequent  fragment passage with ESWL.  Ultimately, patient would like to proceed with ESWL, however she will require approval from the [place] in advance of the procedure.  She is scheduled to see them tomorrow.  Outpatient follow-up scheduled in our clinic this Wednesday to discuss possible ESWL on Thursday per prior [facility] approval.  Carman Ching, PA-C 12/31/2019

## 2019-12-31 NOTE — Plan of Care (Signed)
  Problem: Education: Goal: Knowledge of General Education information will improve Description: Including pain rating scale, medication(s)/side effects and non-pharmacologic comfort measures Outcome: Adequate for Discharge   Problem: Health Behavior/Discharge Planning: Goal: Ability to manage health-related needs will improve Outcome: Adequate for Discharge   Problem: Clinical Measurements: Goal: Ability to maintain clinical measurements within normal limits will improve Outcome: Adequate for Discharge Goal: Will remain free from infection Outcome: Adequate for Discharge Goal: Diagnostic test results will improve Outcome: Adequate for Discharge Goal: Respiratory complications will improve Outcome: Adequate for Discharge Goal: Cardiovascular complication will be avoided Outcome: Adequate for Discharge   Problem: Activity: Goal: Risk for activity intolerance will decrease Outcome: Adequate for Discharge   Problem: Nutrition: Goal: Adequate nutrition will be maintained Outcome: Adequate for Discharge   Problem: Coping: Goal: Level of anxiety will decrease Outcome: Adequate for Discharge   Problem: Elimination: Goal: Will not experience complications related to bowel motility Outcome: Adequate for Discharge Goal: Will not experience complications related to urinary retention Outcome: Adequate for Discharge   Problem: Pain Managment: Goal: General experience of comfort will improve Outcome: Adequate for Discharge   Problem: Safety: Goal: Ability to remain free from injury will improve Outcome: Adequate for Discharge   Problem: Skin Integrity: Goal: Risk for impaired skin integrity will decrease Outcome: Adequate for Discharge  Care plan completed at this time.    

## 2019-12-31 NOTE — Progress Notes (Signed)
Patient given AVS at this time, pt denies questions. IV removed at this time.

## 2020-01-01 LAB — URINE CULTURE: Culture: 50000 — AB

## 2020-01-01 NOTE — Progress Notes (Addendum)
01/02/2020 3:37 PM   Christina Sawyer Jun 30, 1960 [id number]  Referring provider: Concha Pyo, MD [address] [place],  [place] [address] Chief Complaint  Patient presents with  . Nephrolithiasis    HPI: Christina Sawyer is a 59 y.o. female who returns 4 days s/p cystoscopy with insertion of left double-J stent on 12/29/19 with Dr. Annabell Howells.   Patient was presented to the ER yesterday with left flank pain, chills and nausea with vomiting.  She was found to have a 9 mm left UPJ stone with obstruction and a probable UTI with mild AKI but no fever.  It was felt that stenting was indicated. Patient had urgent stent placement by Dr. Annabell Howells urology on 12/29/2019. Patient was given Flomax and strainer.   Urine culture growing pansensitive E. Coli. She was noted to have acute pyelonephritis. She was given IV Rocephin and was later transitioned to Keflex x 12 more days.   Patient reports that she is a lot of pain. She feels like she passed a stone on the right back in 09/2019.   She has had a ureteroscopy in the past.    PMH: Past Medical History:  Diagnosis Date  . Bladder incontinence    "interstim implant"- Right flank  . Complication of anesthesia    "Stadol caused over sedation"  . History of kidney stones   . Hypercholesteremia   . Kidney stone    at present-has stent in place.  Marland Kitchen PONV (postoperative nausea and vomiting)   . Tuberculosis    "latent tuberculosis-tx. 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"    Surgical History: Past Surgical History:  Procedure Laterality Date  . ANAL SPHINCTEROPLASTY N/A 12 2014  . APPENDECTOMY    . CHOLECYSTECTOMY    . CYSTOSCOPY W/ URETERAL STENT PLACEMENT Left 04/15/2015   Procedure: CYSTOSCOPY WITH RETROGRADE PYELOGRAM/URETERAL STENT PLACEMENT;  Surgeon: Marcine Matar, MD;  Location: [facility];  Service: Urology;  Laterality: Left;  . CYSTOSCOPY WITH STENT PLACEMENT Left 12/29/2019   Procedure: CYSTOSCOPY WITH STENT PLACEMENT;  Surgeon: Bjorn Pippin, MD;  Location:  [facility];  Service: Urology;  Laterality: Left;  . CYSTOSCOPY WITH URETEROSCOPY AND STENT PLACEMENT Left 05/22/2015   Procedure: CYSTOSCOPY WITH URETEROSCOPY, STONE BASKETRY AND STENT EXCHANGE;  Surgeon: Marcine Matar, MD;  Location: [facility];  Service: Urology;  Laterality: Left;  . HOLMIUM LASER APPLICATION Left 05/22/2015   Procedure: HOLMIUM LASER APPLICATION;  Surgeon: Marcine Matar, MD;  Location: [facility];  Service: Urology;  Laterality: Left;  . INTERSTIM IMPLANT PLACEMENT    . TUBAL LIGATION      Home Medications:  Allergies as of 01/02/2020      Reactions   Butorphanol Other (See Comments)   "goes under""over sedation" Sensitivity      Medication List       Accurate as of January 02, 2020 11:59 PM. If you have any questions, ask your nurse or doctor.        atorvastatin 40 MG tablet Commonly known as: LIPITOR Take 40 mg by mouth at bedtime.   cephALEXin 500 MG capsule Commonly known as: KEFLEX Take 1 capsule (500 mg total) by mouth 3 (three) times daily for 12 days.   ibuprofen 200 MG tablet Commonly known as: ADVIL Take 400 mg by mouth every 6 (six) hours as needed for moderate pain.   ondansetron 4 MG tablet Commonly known as: ZOFRAN Take 1 tablet (4 mg total) by mouth every 6 (six) hours as needed for nausea.   oxyCODONE 5 MG immediate release tablet  Commonly known as: Oxy IR/ROXICODONE Take 1 tablet (5 mg total) by mouth every 6 (six) hours as needed for severe pain.   solifenacin 10 MG tablet Commonly known as: VESICARE Take 10 mg by mouth every evening. Take 2 hours prior to bedtime   tamsulosin 0.4 MG Caps capsule Commonly known as: Flomax Take 1 capsule (0.4 mg total) by mouth daily after supper.   VAGISIL EX Place 1 application vaginally daily as needed (dryness).       Allergies:  Allergies  Allergen Reactions  . Butorphanol Other (See Comments)    "goes under""over sedation" Sensitivity     Family History: No family history on  file.  Social History:  reports that she is a non-smoker but has been exposed to tobacco smoke. She has never used smokeless tobacco. She reports current alcohol use of about 1.0 standard drink of alcohol per week. She reports that she does not use drugs.   Physical Exam: BP 131/87   Pulse 80   Constitutional:  Alert and oriented, No acute distress. HEENT: [place] AT, moist mucus membranes.  Trachea midline, no masses. Cardiovascular: No clubbing, cyanosis, or edema. Respiratory: Normal respiratory effort, no increased work of breathing. Skin: No rashes, bruises or suspicious lesions. Neurologic: Grossly intact, no focal deficits, moving all 4 extremities. Psychiatric: Normal mood and affect.  Laboratory Data:  Lab Results  Component Value Date   CREATININE 1.06 (H) 12/31/2019    Lab Results  Component Value Date   HGBA1C 6.0 (H) 12/30/2019    Pertinent Imaging: Results for orders placed during the hospital encounter of 12/29/19  DG Abd 1 View  Narrative CLINICAL DATA:  Left kidney stone.  EXAM: ABDOMEN - 1 VIEW  COMPARISON:  June 23, 2015  FINDINGS: The right kidney is partially obscured by bowel contents but there appear be stones in the lower pole, correlating with a CT scan from yesterday. The left kidney is also largely obscured by bowel contents. A ureteral stent has been placed in the interval. The distal aspect of the stent is below today's film. The proximal aspect of the stent is likely in the lower left renal pelvis. There appear to be calcifications adjacent to the proximal pigtail suggestive of stones.  IMPRESSION: 1. A left ureteral stent is been placed in the interval. The distal aspect is not included on today's study. Calcifications adjacent to the proximal pigtail are likely stones in the pelvis or proximal ureter. 2. At least 1 stone is seen in the lower pole the right kidney.   Electronically Signed By: Gerome Sam III M.D On:  12/30/2019 14:25  Results for orders placed during the hospital encounter of 12/29/19  CT Renal Stone Study  Narrative CLINICAL DATA:  Left-sided flank pain history of renal calculi  EXAM: CT ABDOMEN AND PELVIS WITHOUT CONTRAST  TECHNIQUE: Multidetector CT imaging of the abdomen and pelvis was performed following the standard protocol without IV contrast.  COMPARISON:  April 03, 2018  FINDINGS: Lower chest: The visualized heart size within normal limits. No pericardial fluid/thickening.  No hiatal hernia.  The visualized portions of the lungs are clear.  Hepatobiliary: Although limited due to the lack of intravenous contrast, normal in appearance without gross focal abnormality. The patient is status post cholecystectomy. No biliary ductal dilation.  Pancreas:  Unremarkable.  No surrounding inflammatory changes.  Spleen: Normal in size. Although limited due to the lack of intravenous contrast, normal in appearance.  Adrenals/Urinary Tract: Both adrenal glands appear normal. There is  moderate left-sided pelvicaliectasis and proximal ureterectasis with a 9 mm calculus within the proximal left ureter. There is left-sided perinephric and periureteral stranding changes. Punctate calcifications seen in the lower pole of the left kidney. There is scattered multiple calcifications seen in the lower pole of the right kidney the largest measuring 3 mm. The bladder is unremarkable.  Stomach/Bowel: The stomach, small bowel, and colon are normal in appearance. No inflammatory changes or obstructive findings. A moderate amount colonic stool is present.  Vascular/Lymphatic: There are no enlarged abdominal or pelvic lymph nodes. Scattered aortic atherosclerotic calcifications are seen without aneurysmal dilatation.  Reproductive: The uterus and adnexa are unremarkable.  Other: No evidence of abdominal wall mass or hernia.  Musculoskeletal: No acute or significant osseous  findings. Degenerative changes are seen in the thoracolumbar spine with endplate Schmorl's nodes. A overlying nerve stimulator is seen.  IMPRESSION: Moderate left hydronephrosis with perinephric stranding changes from a proximal 9 mm ureteral calculus.  Multiple punctate bilateral nonobstructing renal calculi  Aortic Atherosclerosis (ICD10-I70.0).   Electronically Signed By: Jonna Clark M.D. On: 12/28/2019 23:29  I have personally reviewed the images and agree with radiologist interpretation.    Assessment & Plan:    1. Left UPJ stone Initially had discussed ESWL, but given that stones appear to be multiple, URS more likely to be most effective to achieve stone free status on left  We discussed left ureteroscopy, laser lithotripsy, and stent. We discussed the risks and benefits of both including bleeding, infection, damage to surrounding structures, efficacy with need for possible further intervention, and need for temporary ureteral stent.  Patient is agreeable with plan.   Currently on culture specific abx- complete course   [facility] [address], [address] [place], [place] [address] [phone]  I, Theador Hawthorne, am acting as a scribe for Dr. Vanna Scotland.  I have reviewed the above documentation for accuracy and completeness, and I agree with the above.   Vanna Scotland, MD

## 2020-01-01 NOTE — H&P (View-Only) (Signed)
01/02/2020 3:37 PM   Christina Sawyer Jun 30, 1960 [id number]  Referring provider: Concha Pyo, MD [address] [place],  [place] [address] Chief Complaint  Patient presents with  . Nephrolithiasis    HPI: Christina Sawyer is a 59 y.o. female who returns 4 days s/p cystoscopy with insertion of left double-J stent on 12/29/19 with Dr. Annabell Howells.   Patient was presented to the ER yesterday with left flank pain, chills and nausea with vomiting.  She was found to have a 9 mm left UPJ stone with obstruction and a probable UTI with mild AKI but no fever.  It was felt that stenting was indicated. Patient had urgent stent placement by Dr. Annabell Howells urology on 12/29/2019. Patient was given Flomax and strainer.   Urine culture growing pansensitive E. Coli. She was noted to have acute pyelonephritis. She was given IV Rocephin and was later transitioned to Keflex x 12 more days.   Patient reports that she is a lot of pain. She feels like she passed a stone on the right back in 09/2019.   She has had a ureteroscopy in the past.    PMH: Past Medical History:  Diagnosis Date  . Bladder incontinence    "interstim implant"- Right flank  . Complication of anesthesia    "Stadol caused over sedation"  . History of kidney stones   . Hypercholesteremia   . Kidney stone    at present-has stent in place.  Marland Kitchen PONV (postoperative nausea and vomiting)   . Tuberculosis    "latent tuberculosis-tx. 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"    Surgical History: Past Surgical History:  Procedure Laterality Date  . ANAL SPHINCTEROPLASTY N/A 12 2014  . APPENDECTOMY    . CHOLECYSTECTOMY    . CYSTOSCOPY W/ URETERAL STENT PLACEMENT Left 04/15/2015   Procedure: CYSTOSCOPY WITH RETROGRADE PYELOGRAM/URETERAL STENT PLACEMENT;  Surgeon: Marcine Matar, MD;  Location: [facility];  Service: Urology;  Laterality: Left;  . CYSTOSCOPY WITH STENT PLACEMENT Left 12/29/2019   Procedure: CYSTOSCOPY WITH STENT PLACEMENT;  Surgeon: Bjorn Pippin, MD;  Location:  [facility];  Service: Urology;  Laterality: Left;  . CYSTOSCOPY WITH URETEROSCOPY AND STENT PLACEMENT Left 05/22/2015   Procedure: CYSTOSCOPY WITH URETEROSCOPY, STONE BASKETRY AND STENT EXCHANGE;  Surgeon: Marcine Matar, MD;  Location: [facility];  Service: Urology;  Laterality: Left;  . HOLMIUM LASER APPLICATION Left 05/22/2015   Procedure: HOLMIUM LASER APPLICATION;  Surgeon: Marcine Matar, MD;  Location: [facility];  Service: Urology;  Laterality: Left;  . INTERSTIM IMPLANT PLACEMENT    . TUBAL LIGATION      Home Medications:  Allergies as of 01/02/2020      Reactions   Butorphanol Other (See Comments)   "goes under""over sedation" Sensitivity      Medication List       Accurate as of January 02, 2020 11:59 PM. If you have any questions, ask your nurse or doctor.        atorvastatin 40 MG tablet Commonly known as: LIPITOR Take 40 mg by mouth at bedtime.   cephALEXin 500 MG capsule Commonly known as: KEFLEX Take 1 capsule (500 mg total) by mouth 3 (three) times daily for 12 days.   ibuprofen 200 MG tablet Commonly known as: ADVIL Take 400 mg by mouth every 6 (six) hours as needed for moderate pain.   ondansetron 4 MG tablet Commonly known as: ZOFRAN Take 1 tablet (4 mg total) by mouth every 6 (six) hours as needed for nausea.   oxyCODONE 5 MG immediate release tablet  Commonly known as: Oxy IR/ROXICODONE Take 1 tablet (5 mg total) by mouth every 6 (six) hours as needed for severe pain.   solifenacin 10 MG tablet Commonly known as: VESICARE Take 10 mg by mouth every evening. Take 2 hours prior to bedtime   tamsulosin 0.4 MG Caps capsule Commonly known as: Flomax Take 1 capsule (0.4 mg total) by mouth daily after supper.   VAGISIL EX Place 1 application vaginally daily as needed (dryness).       Allergies:  Allergies  Allergen Reactions  . Butorphanol Other (See Comments)    "goes under""over sedation" Sensitivity     Family History: No family history on  file.  Social History:  reports that she is a non-smoker but has been exposed to tobacco smoke. She has never used smokeless tobacco. She reports current alcohol use of about 1.0 standard drink of alcohol per week. She reports that she does not use drugs.   Physical Exam: BP 131/87   Pulse 80   Constitutional:  Alert and oriented, No acute distress. HEENT: [place] AT, moist mucus membranes.  Trachea midline, no masses. Cardiovascular: No clubbing, cyanosis, or edema. Respiratory: Normal respiratory effort, no increased work of breathing. Skin: No rashes, bruises or suspicious lesions. Neurologic: Grossly intact, no focal deficits, moving all 4 extremities. Psychiatric: Normal mood and affect.  Laboratory Data:  Lab Results  Component Value Date   CREATININE 1.06 (H) 12/31/2019    Lab Results  Component Value Date   HGBA1C 6.0 (H) 12/30/2019    Pertinent Imaging: Results for orders placed during the hospital encounter of 12/29/19  DG Abd 1 View  Narrative CLINICAL DATA:  Left kidney stone.  EXAM: ABDOMEN - 1 VIEW  COMPARISON:  June 23, 2015  FINDINGS: The right kidney is partially obscured by bowel contents but there appear be stones in the lower pole, correlating with a CT scan from yesterday. The left kidney is also largely obscured by bowel contents. A ureteral stent has been placed in the interval. The distal aspect of the stent is below today's film. The proximal aspect of the stent is likely in the lower left renal pelvis. There appear to be calcifications adjacent to the proximal pigtail suggestive of stones.  IMPRESSION: 1. A left ureteral stent is been placed in the interval. The distal aspect is not included on today's study. Calcifications adjacent to the proximal pigtail are likely stones in the pelvis or proximal ureter. 2. At least 1 stone is seen in the lower pole the right kidney.   Electronically Signed By: Gerome Sam III M.D On:  12/30/2019 14:25  Results for orders placed during the hospital encounter of 12/29/19  CT Renal Stone Study  Narrative CLINICAL DATA:  Left-sided flank pain history of renal calculi  EXAM: CT ABDOMEN AND PELVIS WITHOUT CONTRAST  TECHNIQUE: Multidetector CT imaging of the abdomen and pelvis was performed following the standard protocol without IV contrast.  COMPARISON:  April 03, 2018  FINDINGS: Lower chest: The visualized heart size within normal limits. No pericardial fluid/thickening.  No hiatal hernia.  The visualized portions of the lungs are clear.  Hepatobiliary: Although limited due to the lack of intravenous contrast, normal in appearance without gross focal abnormality. The patient is status post cholecystectomy. No biliary ductal dilation.  Pancreas:  Unremarkable.  No surrounding inflammatory changes.  Spleen: Normal in size. Although limited due to the lack of intravenous contrast, normal in appearance.  Adrenals/Urinary Tract: Both adrenal glands appear normal. There is  moderate left-sided pelvicaliectasis and proximal ureterectasis with a 9 mm calculus within the proximal left ureter. There is left-sided perinephric and periureteral stranding changes. Punctate calcifications seen in the lower pole of the left kidney. There is scattered multiple calcifications seen in the lower pole of the right kidney the largest measuring 3 mm. The bladder is unremarkable.  Stomach/Bowel: The stomach, small bowel, and colon are normal in appearance. No inflammatory changes or obstructive findings. A moderate amount colonic stool is present.  Vascular/Lymphatic: There are no enlarged abdominal or pelvic lymph nodes. Scattered aortic atherosclerotic calcifications are seen without aneurysmal dilatation.  Reproductive: The uterus and adnexa are unremarkable.  Other: No evidence of abdominal wall mass or hernia.  Musculoskeletal: No acute or significant osseous  findings. Degenerative changes are seen in the thoracolumbar spine with endplate Schmorl's nodes. A overlying nerve stimulator is seen.  IMPRESSION: Moderate left hydronephrosis with perinephric stranding changes from a proximal 9 mm ureteral calculus.  Multiple punctate bilateral nonobstructing renal calculi  Aortic Atherosclerosis (ICD10-I70.0).   Electronically Signed By: Jonna Clark M.D. On: 12/28/2019 23:29  I have personally reviewed the images and agree with radiologist interpretation.    Assessment & Plan:    1. Left UPJ stone Initially had discussed ESWL, but given that stones appear to be multiple, URS more likely to be most effective to achieve stone free status on left  We discussed left ureteroscopy, laser lithotripsy, and stent. We discussed the risks and benefits of both including bleeding, infection, damage to surrounding structures, efficacy with need for possible further intervention, and need for temporary ureteral stent.  Patient is agreeable with plan.   Currently on culture specific abx- complete course   [facility] [address], [address] [place], [place] [address] [phone]  I, Theador Hawthorne, am acting as a scribe for Dr. Vanna Scotland.  I have reviewed the above documentation for accuracy and completeness, and I agree with the above.   Vanna Scotland, MD

## 2020-01-02 ENCOUNTER — Other Ambulatory Visit: Payer: Self-pay

## 2020-01-02 ENCOUNTER — Other Ambulatory Visit: Payer: Self-pay | Admitting: Radiology

## 2020-01-02 ENCOUNTER — Ambulatory Visit (INDEPENDENT_AMBULATORY_CARE_PROVIDER_SITE_OTHER): Payer: No Typology Code available for payment source | Admitting: Urology

## 2020-01-02 VITALS — BP 131/87 | HR 80

## 2020-01-02 DIAGNOSIS — N200 Calculus of kidney: Secondary | ICD-10-CM

## 2020-01-02 DIAGNOSIS — N201 Calculus of ureter: Secondary | ICD-10-CM

## 2020-01-03 ENCOUNTER — Encounter: Admission: RE | Payer: Self-pay | Source: Home / Self Care

## 2020-01-03 ENCOUNTER — Other Ambulatory Visit
Admission: RE | Admit: 2020-01-03 | Discharge: 2020-01-03 | Disposition: A | Discharge status: Home / Self Care | Payer: No Typology Code available for payment source | Source: Ambulatory Visit | Attending: Urology | Admitting: Urology

## 2020-01-03 ENCOUNTER — Ambulatory Visit
Admission: RE | Admit: 2020-01-03 | Payer: No Typology Code available for payment source | Source: Home / Self Care | Admitting: Urology

## 2020-01-03 DIAGNOSIS — Z01812 Encounter for preprocedural laboratory examination: Secondary | ICD-10-CM | POA: Diagnosis present

## 2020-01-03 DIAGNOSIS — Z20822 Contact with and (suspected) exposure to covid-19: Secondary | ICD-10-CM | POA: Diagnosis not present

## 2020-01-03 SURGERY — LITHOTRIPSY, ESWL
Anesthesia: Moderate Sedation | Laterality: Left

## 2020-01-04 ENCOUNTER — Other Ambulatory Visit: Payer: Self-pay

## 2020-01-04 ENCOUNTER — Encounter
Admission: RE | Admit: 2020-01-04 | Discharge: 2020-01-04 | Disposition: A | Discharge status: Home / Self Care | Payer: No Typology Code available for payment source | Source: Ambulatory Visit | Attending: Urology | Admitting: Urology

## 2020-01-04 HISTORY — DX: Personal history of urinary calculi: Z87.442

## 2020-01-04 LAB — SARS CORONAVIRUS 2 (TAT 6-24 HRS): SARS Coronavirus 2: NEGATIVE

## 2020-01-04 NOTE — Patient Instructions (Signed)
Your procedure is scheduled on: January 07, 2020 MONDAY Report to Day Surgery on the 2nd floor of the [facility] AT 10:30 AM   REMEMBER: Instructions that are not followed completely may result in serious medical risk, up to and including death; or upon the discretion of your surgeon and anesthesiologist your surgery may need to be rescheduled.  Do not eat food after midnight the night before surgery.  No gum chewing, lozengers or hard candies.  You may however, drink CLEAR liquids up to 2 hours before you are scheduled to arrive for your surgery. Do not drink anything within 2 hours of your scheduled arrival time.  Clear liquids include: - water  - apple juice without pulp - gatorade (not RED, PURPLE, OR BLUE) - black coffee or tea (Do NOT add milk or creamers to the coffee or tea) Do NOT drink anything that is not on this list.  Type 1 and Type 2 diabetics should only drink water.  TAKE THESE MEDICATIONS THE MORNING OF SURGERY WITH A SIP OF WATER: PAIN PILL IF NEEDED   Follow recommendations from Cardiologist, Pulmonologist or PCP regarding stopping ASPIRIN OR Aspirin, Coumadin, Plavix, Eliquis, Pradaxa, or Pletal.  One week prior to surgery: Stop Anti-inflammatories (NSAIDS) such as Advil, Aleve, Ibuprofen, Motrin, Naproxen, Naprosyn and Aspirin based products such as Excedrin, Goodys Powder, BC Powder. Stop ANY OVER THE COUNTER supplements until after surgery. (You may continue taking Tylenol, Vitamin D, Vitamin B, and multivitamin.)  No Alcohol for 24 hours before or after surgery.  No Smoking including e-cigarettes for 24 hours prior to surgery.  No chewable tobacco products for at least 6 hours prior to surgery.  No nicotine patches on the day of surgery.  Do not use any "recreational" drugs for at least a week prior to your surgery.  Please be advised that the combination of cocaine and anesthesia may have negative outcomes, up to and including death. If you test  positive for cocaine, your surgery will be cancelled.  On the morning of surgery brush your teeth with toothpaste and water, you may rinse your mouth with mouthwash if you wish. Do not swallow any toothpaste or mouthwash.  Do not wear jewelry, make-up, hairpins, clips or nail polish.  Do not wear lotions, powders, or perfumes.   Do not shave 48 hours prior to surgery.   Contact lenses, hearing aids and dentures may not be worn into surgery.  Do not bring valuables to the hospital. [facility] is not responsible for any missing/lost belongings or valuables.   SHOWER MORNING OF SHOWER.  Notify your doctor if there is any change in your medical condition (cold, fever, infection).  Wear comfortable clothing (specific to your surgery type) to the hospital.  Plan for stool softeners for home use; pain medications have a tendency to cause constipation. You can also help prevent constipation by eating foods high in fiber such as fruits and vegetables and drinking plenty of fluids as your diet allows.  After surgery, you can help prevent lung complications by doing breathing exercises.  Take deep breaths and cough every 1-2 hours. Your doctor may order a device called an Incentive Spirometer to help you take deep breaths. When coughing or sneezing, hold a pillow firmly against your incision with both hands. This is called "splinting." Doing this helps protect your incision. It also decreases belly discomfort.  If you are being discharged the day of surgery, you will not be allowed to drive home. You will need a  responsible adult (18 years or older) to drive you home and stay with you that night.   Please call the Pre-admissions Testing Dept. at [phone] if you have any questions about these instructions.  Visitation Policy:  Patients undergoing a surgery or procedure may have one family member or support person with them as long as that person is not COVID-19 positive or experiencing  its symptoms.  That person may remain in the waiting area during the procedure.  Inpatient Visitation Update:   In an effort to ensure the safety of our team members and our patients, we are implementing a change to our visitation policy:  Effective Monday, Aug. 9, at 7 a.m., inpatients will be allowed one support person.  o The support person may change daily.  o The support person must pass our screening, gel in and out, and wear a mask at all times, including in the patient's room.  o Patients must also wear a mask when staff or their support person are in the room.  o Masking is required regardless of vaccination status.  Systemwide, no visitors 17 or younger.

## 2020-01-07 ENCOUNTER — Other Ambulatory Visit: Payer: Self-pay

## 2020-01-07 ENCOUNTER — Ambulatory Visit: Payer: No Typology Code available for payment source | Admitting: Certified Registered Nurse Anesthetist

## 2020-01-07 ENCOUNTER — Ambulatory Visit: Payer: No Typology Code available for payment source

## 2020-01-07 ENCOUNTER — Encounter
Admission: RE | Disposition: A | Discharge status: Home / Self Care | Payer: Self-pay | Source: Home / Self Care | Attending: Urology

## 2020-01-07 ENCOUNTER — Ambulatory Visit
Admission: RE | Admit: 2020-01-07 | Discharge: 2020-01-07 | Disposition: A | Discharge status: Home / Self Care | Payer: No Typology Code available for payment source | Attending: Urology | Admitting: Urology

## 2020-01-07 ENCOUNTER — Encounter: Payer: Self-pay | Admitting: Urology

## 2020-01-07 DIAGNOSIS — Z87442 Personal history of urinary calculi: Secondary | ICD-10-CM | POA: Diagnosis not present

## 2020-01-07 DIAGNOSIS — Z888 Allergy status to other drugs, medicaments and biological substances status: Secondary | ICD-10-CM | POA: Diagnosis not present

## 2020-01-07 DIAGNOSIS — N200 Calculus of kidney: Secondary | ICD-10-CM

## 2020-01-07 DIAGNOSIS — Z9049 Acquired absence of other specified parts of digestive tract: Secondary | ICD-10-CM | POA: Insufficient documentation

## 2020-01-07 DIAGNOSIS — R000 Tachycardia, unspecified: Secondary | ICD-10-CM | POA: Diagnosis not present

## 2020-01-07 DIAGNOSIS — E7800 Pure hypercholesterolemia, unspecified: Secondary | ICD-10-CM | POA: Diagnosis not present

## 2020-01-07 DIAGNOSIS — N132 Hydronephrosis with renal and ureteral calculous obstruction: Secondary | ICD-10-CM | POA: Diagnosis not present

## 2020-01-07 HISTORY — PX: CYSTOSCOPY/URETEROSCOPY/HOLMIUM LASER/STENT PLACEMENT: SHX6546

## 2020-01-07 SURGERY — CYSTOSCOPY/URETEROSCOPY/HOLMIUM LASER/STENT PLACEMENT
Anesthesia: General | Laterality: Left

## 2020-01-07 MED ORDER — LACTATED RINGERS IV SOLN: INTRAVENOUS | Status: DC

## 2020-01-07 MED ORDER — ONDANSETRON HCL 4 MG/2ML IJ SOLN
INTRAMUSCULAR | Status: DC | PRN
  Administered 2020-01-07: 4 mg via INTRAVENOUS

## 2020-01-07 MED ORDER — FAMOTIDINE 20 MG PO TABS: 20.0000 mg | ORAL_TABLET | Freq: Once | ORAL | Status: AC

## 2020-01-07 MED ORDER — DEXAMETHASONE SODIUM PHOSPHATE 10 MG/ML IJ SOLN
INTRAMUSCULAR | Status: DC | PRN
  Administered 2020-01-07: 10 mg via INTRAVENOUS

## 2020-01-07 MED ORDER — ONDANSETRON HCL 4 MG/2ML IJ SOLN: 4.0000 mg | Freq: Once | INTRAMUSCULAR | Status: AC

## 2020-01-07 MED ORDER — ROCURONIUM BROMIDE 10 MG/ML (PF) SYRINGE
PREFILLED_SYRINGE | INTRAVENOUS | Status: AC
  Filled 2020-01-07: qty 10

## 2020-01-07 MED ORDER — FENTANYL CITRATE (PF) 250 MCG/5ML IJ SOLN
INTRAMUSCULAR | Status: DC | PRN
  Administered 2020-01-07: 25 ug via INTRAVENOUS
  Administered 2020-01-07: 50 ug via INTRAVENOUS
  Administered 2020-01-07: 25 ug via INTRAVENOUS

## 2020-01-07 MED ORDER — OXYCODONE HCL 5 MG PO TABS
ORAL_TABLET | ORAL | Status: AC
  Administered 2020-01-07: 5 mg via ORAL
  Filled 2020-01-07: qty 1

## 2020-01-07 MED ORDER — CEFAZOLIN SODIUM-DEXTROSE 2-4 GM/100ML-% IV SOLN
2.0000 g | INTRAVENOUS | Status: AC
  Administered 2020-01-07: 2 g via INTRAVENOUS

## 2020-01-07 MED ORDER — PROPOFOL 10 MG/ML IV BOLUS
INTRAVENOUS | Status: AC
  Filled 2020-01-07: qty 20

## 2020-01-07 MED ORDER — ROCURONIUM BROMIDE 100 MG/10ML IV SOLN
INTRAVENOUS | Status: DC | PRN
  Administered 2020-01-07: 50 mg via INTRAVENOUS

## 2020-01-07 MED ORDER — MIDAZOLAM HCL 2 MG/2ML IJ SOLN
INTRAMUSCULAR | Status: DC | PRN
  Administered 2020-01-07: 2 mg via INTRAVENOUS

## 2020-01-07 MED ORDER — ONDANSETRON HCL 4 MG/2ML IJ SOLN
INTRAMUSCULAR | Status: AC
  Filled 2020-01-07: qty 2

## 2020-01-07 MED ORDER — DOCUSATE SODIUM 100 MG PO CAPS
100.0000 mg | ORAL_CAPSULE | Freq: Once | ORAL | Status: AC
  Administered 2020-01-07: 100 mg via ORAL
  Filled 2020-01-07: qty 1

## 2020-01-07 MED ORDER — SOLIFENACIN SUCCINATE 10 MG PO TABS: 10.0000 mg | ORAL_TABLET | Freq: Every evening | ORAL | 0 refills | Status: AC

## 2020-01-07 MED ORDER — ONDANSETRON HCL 4 MG/2ML IJ SOLN
INTRAMUSCULAR | Status: AC
  Administered 2020-01-07: 4 mg via INTRAVENOUS
  Filled 2020-01-07: qty 2

## 2020-01-07 MED ORDER — DEXAMETHASONE SODIUM PHOSPHATE 10 MG/ML IJ SOLN
INTRAMUSCULAR | Status: AC
  Filled 2020-01-07: qty 1

## 2020-01-07 MED ORDER — LIDOCAINE HCL (PF) 2 % IJ SOLN
INTRAMUSCULAR | Status: AC
  Filled 2020-01-07: qty 5

## 2020-01-07 MED ORDER — PROMETHAZINE HCL 25 MG/ML IJ SOLN: 6.2500 mg | INTRAMUSCULAR | Status: DC | PRN

## 2020-01-07 MED ORDER — FAMOTIDINE 20 MG PO TABS
ORAL_TABLET | ORAL | Status: AC
  Administered 2020-01-07: 20 mg via ORAL
  Filled 2020-01-07: qty 1

## 2020-01-07 MED ORDER — PROPOFOL 10 MG/ML IV BOLUS
INTRAVENOUS | Status: DC | PRN
  Administered 2020-01-07: 110 mg via INTRAVENOUS

## 2020-01-07 MED ORDER — CHLORHEXIDINE GLUCONATE 0.12 % MT SOLN
OROMUCOSAL | Status: AC
  Administered 2020-01-07: 15 mL via OROMUCOSAL
  Filled 2020-01-07: qty 15

## 2020-01-07 MED ORDER — ORAL CARE MOUTH RINSE: 15.0000 mL | Freq: Once | OROMUCOSAL | Status: AC

## 2020-01-07 MED ORDER — FENTANYL CITRATE (PF) 100 MCG/2ML IJ SOLN
INTRAMUSCULAR | Status: AC
  Filled 2020-01-07: qty 2

## 2020-01-07 MED ORDER — CEFAZOLIN SODIUM-DEXTROSE 2-4 GM/100ML-% IV SOLN
INTRAVENOUS | Status: AC
  Filled 2020-01-07: qty 100

## 2020-01-07 MED ORDER — LIDOCAINE HCL (CARDIAC) PF 100 MG/5ML IV SOSY
PREFILLED_SYRINGE | INTRAVENOUS | Status: DC | PRN
  Administered 2020-01-07: 60 mg via INTRAVENOUS

## 2020-01-07 MED ORDER — MIDAZOLAM HCL 2 MG/2ML IJ SOLN
INTRAMUSCULAR | Status: AC
  Filled 2020-01-07: qty 2

## 2020-01-07 MED ORDER — DOCUSATE SODIUM 100 MG PO CAPS: 100.0000 mg | ORAL_CAPSULE | Freq: Two times a day (BID) | ORAL | 0 refills | Status: DC

## 2020-01-07 MED ORDER — SUGAMMADEX SODIUM 200 MG/2ML IV SOLN
INTRAVENOUS | Status: DC | PRN
  Administered 2020-01-07: 200 mg via INTRAVENOUS

## 2020-01-07 MED ORDER — OXYCODONE HCL 5 MG PO TABS: 5.0000 mg | ORAL_TABLET | Freq: Once | ORAL | Status: AC

## 2020-01-07 MED ORDER — CHLORHEXIDINE GLUCONATE 0.12 % MT SOLN: 15.0000 mL | Freq: Once | OROMUCOSAL | Status: AC

## 2020-01-07 MED ORDER — FENTANYL CITRATE (PF) 100 MCG/2ML IJ SOLN
25.0000 ug | INTRAMUSCULAR | Status: DC | PRN
  Administered 2020-01-07: 25 ug via INTRAVENOUS

## 2020-01-07 SURGICAL SUPPLY — 29 items
BAG DRAIN CYSTO-URO LG1000N (MISCELLANEOUS) ×2 IMPLANT
BASKET ZERO TIP 1.9FR (BASKET) IMPLANT
BRUSH SCRUB EZ 1% IODOPHOR (MISCELLANEOUS) ×2 IMPLANT
BSKT STON RTRVL ZERO TP 1.9FR (BASKET)
CATH URETL 5X70 OPEN END (CATHETERS) ×2 IMPLANT
CNTNR SPEC 2.5X3XGRAD LEK (MISCELLANEOUS)
CONT SPEC 4OZ STER OR WHT (MISCELLANEOUS)
CONT SPEC 4OZ STRL OR WHT (MISCELLANEOUS)
CONTAINER SPEC 2.5X3XGRAD LEK (MISCELLANEOUS) IMPLANT
DRAPE UTILITY 15X26 TOWEL STRL (DRAPES) ×2 IMPLANT
GLOVE BIO SURGEON STRL SZ 6.5 (GLOVE) ×2 IMPLANT
GOWN STRL REUS W/ TWL LRG LVL3 (GOWN DISPOSABLE) ×2 IMPLANT
GOWN STRL REUS W/TWL LRG LVL3 (GOWN DISPOSABLE) ×4
GUIDEWIRE GREEN .038 145CM (MISCELLANEOUS) IMPLANT
GUIDEWIRE STR DUAL SENSOR (WIRE) ×2 IMPLANT
INFUSOR MANOMETER BAG 3000ML (MISCELLANEOUS) ×2 IMPLANT
INTRODUCER DILATOR DOUBLE (INTRODUCER) ×2 IMPLANT
KIT TURNOVER CYSTO (KITS) ×2 IMPLANT
PACK CYSTO AR (MISCELLANEOUS) ×2 IMPLANT
SET CYSTO W/LG BORE CLAMP LF (SET/KITS/TRAYS/PACK) ×2 IMPLANT
SHEATH URETERAL 12FRX35CM (MISCELLANEOUS) ×2 IMPLANT
SOL .9 NS 3000ML IRR  AL (IV SOLUTION) ×1
SOL .9 NS 3000ML IRR AL (IV SOLUTION) ×1
SOL .9 NS 3000ML IRR UROMATIC (IV SOLUTION) ×1 IMPLANT
STENT URET 6FRX24 CONTOUR (STENTS) ×2 IMPLANT
STENT URET 6FRX26 CONTOUR (STENTS) IMPLANT
SURGILUBE 2OZ TUBE FLIPTOP (MISCELLANEOUS) ×2 IMPLANT
TRACTIP FLEXIVA PULSE ID 200 (Laser) ×2 IMPLANT
WATER STERILE IRR 1000ML POUR (IV SOLUTION) ×2 IMPLANT

## 2020-01-07 NOTE — Anesthesia Procedure Notes (Signed)
Procedure Name: Intubation Date/Time: 01/07/2020 11:20 AM Performed by: Dava Najjar, CRNA Pre-anesthesia Checklist: Patient identified, Emergency Drugs available, Suction available and Patient being monitored Patient Re-evaluated:Patient Re-evaluated prior to induction Oxygen Delivery Method: Circle system utilized Preoxygenation: Pre-oxygenation with 100% oxygen Induction Type: IV induction Ventilation: Mask ventilation without difficulty Laryngoscope Size: McGraph and 3 Grade View: Grade I Tube type: Oral Tube size: 7.0 mm Number of attempts: 1 Airway Equipment and Method: Stylet Placement Confirmation: ETT inserted through vocal cords under direct vision,  positive ETCO2 and breath sounds checked- equal and bilateral Secured at: 21 cm Tube secured with: Tape Dental Injury: Teeth and Oropharynx as per pre-operative assessment

## 2020-01-07 NOTE — Op Note (Signed)
Date of procedure: 01/07/20  Preoperative diagnosis:  1. Left kidney stone  Postoperative diagnosis:  1. Same as above  Procedure: 1. Left ureteroscopy with laser lithotripsy 2. Left ureteral stent exchange 3. Left retrograde pyelogram 4. Interpretation of fluoroscopy less than 30 minutes  Surgeon: Vanna Scotland, MD  Anesthesia: General  Complications: None  Intraoperative findings: Previously identified stone on CT now within the renal pelvis, no longer obstructing the infundibulum.  Uncomplicated laser lithotripsy with basket extraction of stone fragment.  Stent exchanged on tether.  EBL: Minimal   Specimens: none  Drains: 6 x 24 French double-J ureteral stent on left with tether  Indication:Christina Sawyer is a history of left-sided kidney stone presenting with UTI/ sepsis now s/p stent who returns today for definitive management of her stone.   After reviewing the management options for treatment,s he elected to proceed with the above surgical procedure(s). We have discussed the potential benefits and risks of the procedure, side effects of the proposed treatment, the likelihood of the patient achieving the goals of the procedure, and any potential problems that might occur during the procedure or recuperation. Informed consent has been obtained.  Description of procedure:  The patient was taken to the operating room and general anesthesia was induced.  The patient was placed in the dorsal lithotomy position, prepped and draped in the usual sterile fashion, and preoperative antibiotics were administered. A preoperative time-out was performed.   A 21 French scope was advanced per urethra into the bladder.  Attention was turned to the left ureteral orifice which a ureteral stent was seen emanating.  The coil was grasped and brought to level the urethral meatus.  It was then cannulated using a sensor wire up to the level of the kidney.  The stone was seen within the renal  pelvis on scout imaging.  A dual-lumen access sheath was then used to introduce a second Super Stiff wire up to the level of the kidney.  Prior to doing so, injected Conray through the dual-lumen to create a roadmap of the kidney.  There is no contrast extravasation seen and a small filling defect was appreciated within the renal pelvis at the location of the stone.  The Super Stiff wire was then used as a working wire.  A 12/14 French Cook ureteral access sheath was advanced over this Super Stiff wire to the level of the proximal ureter under fluoroscopic guidance which went easily.  The inner cannula was removed.  A digital flexible ureteroscope was then advanced to the level of the renal pelvis through the access sheath where the stone was encountered within a lower pole calyx.  It was extremely soft with a few small additional satellite stones in the lower pole. A 200 m laser fiber was then brought in using dusting settings of 0.2 J and 40 Hz, the stone was dusted into tiny fragments.  These fragments were so small and staff that basketing for sample was not possible. At the end of the procedure, no fragments greater than the tip of the laser fiber were seen.  Each every calyx was directly visualized with satisfactory stone clearance.  The scope was then backed out length of the ureter removing the access sheath along the way.  There was no ureteral injuries, or any other stone fragments identified along the course of the ureter.  Finally, the safety wire was backloaded over rigid cystoscope.  A 6 x 24 French double-J ureteral stent was advanced over the safety wire under fluoroscopic  guidance to the level of the kidney.  The wire was removed and a complete coil was created both within the renal pelvis as well as within the bladder.  The stent string was left attached to distal coil which was secured to the patient'sleft inner tigh using Mastisol and Tegaderm.  He was then cleaned and dried after being reversed  from anesthesia and taken to the PACU in stable condition.  Plan: She will remove his own stent on Wednesday.  She will follow-up with me in 4 weeks with renal ultrasound prior.  Findings were discussed with her son today by phone.  All questions answered.  Vanna Scotland, M.D.

## 2020-01-07 NOTE — Anesthesia Preprocedure Evaluation (Signed)
Anesthesia Evaluation  Patient identified by MRN, date of birth, ID band Patient awake    Reviewed: Allergy & Precautions, H&P , NPO status , Patient's Chart, lab work & pertinent test results  History of Anesthesia Complications (+) PONV and history of anesthetic complications  Airway Mallampati: II  TM Distance: >3 FB     Dental  (+) Poor Dentition, Loose, Dental Advidsory Given   Pulmonary neg pulmonary ROS, neg shortness of breath, neg sleep apnea, neg COPD, neg recent URI,    breath sounds clear to auscultation       Cardiovascular (-) angina(-) Past MI and (-) Cardiac Stents negative cardio ROS  (-) dysrhythmias  Rhythm:regular Rate:Tachycardia     Neuro/Psych negative neurological ROS  negative psych ROS   GI/Hepatic negative GI ROS, Neg liver ROS,   Endo/Other  negative endocrine ROS  Renal/GU Renal diseaseRenal stone, AKI     Musculoskeletal   Abdominal   Peds  Hematology negative hematology ROS (+)   Anesthesia Other Findings +N/V  Past Medical History: No date: Bladder incontinence     Comment:  "interstim implant"- Right flank No date: Complication of anesthesia     Comment:  "Stadol caused over sedation" No date: Hypercholesteremia No date: Kidney stone     Comment:  at present-has stent in place. 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"  Past Surgical History: 12 2014: ANAL SPHINCTEROPLASTY; N/A No date: APPENDECTOMY No date: CHOLECYSTECTOMY 04/15/2015: CYSTOSCOPY W/ URETERAL STENT PLACEMENT; Left     Comment:  Procedure: CYSTOSCOPY WITH RETROGRADE PYELOGRAM/URETERAL              STENT PLACEMENT;  Surgeon: Marcine Matar, MD;                Location: [facility];  Service: Urology;  Laterality: Left; 05/22/2015: CYSTOSCOPY WITH URETEROSCOPY AND STENT PLACEMENT; Left     Comment:  Procedure: CYSTOSCOPY  WITH URETEROSCOPY, STONE BASKETRY               AND STENT EXCHANGE;  Surgeon: Marcine Matar, MD;                Location: [facility];  Service: Urology;  Laterality: Left; No date: HIP SURGERY     Comment:  right 05/22/2015: HOLMIUM LASER APPLICATION; Left     Comment:  Procedure: HOLMIUM LASER APPLICATION;  Surgeon: Marcine Matar, MD;  Location: [facility];  Service: Urology;                Laterality: Left; No date: TUBAL LIGATION  BMI    Body Mass Index: 25.50 kg/m      Reproductive/Obstetrics negative OB ROS                             Anesthesia Physical  Anesthesia Plan  ASA: II  Anesthesia Plan: General   Post-op Pain Management:    Induction: Intravenous  PONV Risk Score and Plan: Ondansetron, Dexamethasone and Treatment may vary due to age or medical condition  Airway Management Planned: LMA and Oral ETT  Additional Equipment:   Intra-op Plan:   Post-operative Plan: Extubation in OR  Informed Consent: I have reviewed the patients History and Physical, chart, labs and discussed the procedure including the risks, benefits and alternatives for the proposed anesthesia  with the patient or authorized representative who has indicated his/her understanding and acceptance.     Dental Advisory Given  Plan Discussed with: Anesthesiologist, CRNA and Surgeon  Anesthesia Plan Comments:         Anesthesia Quick Evaluation

## 2020-01-07 NOTE — Discharge Instructions (Signed)
You have a ureteral stent in place.  This is a tube that extends from your kidney to your bladder.  This may cause urinary bleeding, burning with urination, and urinary frequency.  Please call our office or present to the ED if you develop fevers >101 or pain which is not able to be controlled with oral pain medications.  You may be given either Flomax and/ or ditropan to help with bladder spasms and stent pain in addition to pain medications.    Your stent is on a string.  It is taped to your left inner thigh.  On Wednesday morning, you may untape this and pull gently until the entire stent is removed.  If you have any questions or concerns, please feel free to call our office.  [facility] [address] [place], [place] [address] [phone]    AMBULATORY SURGERY  DISCHARGE INSTRUCTIONS   1) The drugs that you were given will stay in your system until tomorrow so for the next 24 hours you should not:  A) Drive an automobile B) Make any legal decisions C) Drink any alcoholic beverage   2) You may resume regular meals tomorrow.  Today it is better to start with liquids and gradually work up to solid foods.  You may eat anything you prefer, but it is better to start with liquids, then soup and crackers, and gradually work up to solid foods.   3) Please notify your doctor immediately if you have any unusual bleeding, trouble breathing, redness and pain at the surgery site, drainage, fever, or pain not relieved by medication.    4) Additional Instructions:        Please contact your physician with any problems or Same Day Surgery at [phone], Monday through Friday 6 am to 4 pm, or [place] at [facility] number at [phone].

## 2020-01-07 NOTE — Transfer of Care (Signed)
Immediate Anesthesia Transfer of Care Note  Patient: Christina Sawyer  Procedure(s) Performed: CYSTOSCOPY/URETEROSCOPY/HOLMIUM LASER/STENT PLACEMENT (Left )  Patient Location: PACU  Anesthesia Type:General  Level of Consciousness: drowsy  Airway & Oxygen Therapy: Patient Spontanous Breathing and Patient connected to face mask oxygen  Post-op Assessment: Report given to RN and Post -op Vital signs reviewed and stable  Post vital signs: Reviewed and stable  Last Vitals:  Vitals Value Taken Time  BP 133/87 01/07/20 1211  Temp    Pulse 85 01/07/20 1211  Resp 10 01/07/20 1211  SpO2 99 % 01/07/20 1211  Vitals shown include unvalidated device data.  Last Pain:  Vitals:   01/07/20 1025  TempSrc: Oral  PainSc: 4          Complications: No complications documented.

## 2020-01-07 NOTE — Interval H&P Note (Signed)
History and Physical Interval Note:  01/07/2020 10:47 AM  Christina Sawyer  has presented today for surgery, with the diagnosis of left nephrolithiasis.  The various methods of treatment have been discussed with the patient and family. After consideration of risks, benefits and other options for treatment, the patient has consented to  Procedure(s): CYSTOSCOPY/URETEROSCOPY/HOLMIUM LASER/STENT PLACEMENT (Left) as a surgical intervention.  The patient's history has been reviewed, patient examined, no change in status, stable for surgery.  I have reviewed the patient's chart and labs.  Questions were answered to the patient's satisfaction.    RRR CTAB  Vanna Scotland

## 2020-01-08 ENCOUNTER — Encounter: Payer: Self-pay | Admitting: Urology

## 2020-01-09 ENCOUNTER — Other Ambulatory Visit: Payer: Self-pay

## 2020-01-09 ENCOUNTER — Ambulatory Visit (INDEPENDENT_AMBULATORY_CARE_PROVIDER_SITE_OTHER): Payer: No Typology Code available for payment source | Admitting: Urology

## 2020-01-09 ENCOUNTER — Other Ambulatory Visit: Payer: Self-pay | Admitting: Radiology

## 2020-01-09 ENCOUNTER — Encounter: Payer: Self-pay | Admitting: Urology

## 2020-01-09 VITALS — BP 117/87 | HR 97 | Ht 66.5 in | Wt 155.0 lb

## 2020-01-09 DIAGNOSIS — N200 Calculus of kidney: Secondary | ICD-10-CM

## 2020-01-09 DIAGNOSIS — N201 Calculus of ureter: Secondary | ICD-10-CM

## 2020-01-09 NOTE — Progress Notes (Signed)
Patient present today for a string stent removal. String was found secured to the thigh with a Tegaderm. This was removed and stent was removed with out difficulty. Patient tolerated well

## 2020-01-09 NOTE — Anesthesia Postprocedure Evaluation (Signed)
Anesthesia Post Note  Patient: Christina Sawyer  Procedure(s) Performed: CYSTOSCOPY/URETEROSCOPY/HOLMIUM LASER/STENT PLACEMENT (Left )  Patient location during evaluation: PACU Anesthesia Type: General Level of consciousness: awake and alert Pain management: pain level controlled Vital Signs Assessment: post-procedure vital signs reviewed and stable Respiratory status: spontaneous breathing, nonlabored ventilation, respiratory function stable and patient connected to nasal cannula oxygen Cardiovascular status: blood pressure returned to baseline and stable Postop Assessment: no apparent nausea or vomiting Anesthetic complications: no   No complications documented.   Last Vitals:  Vitals:   01/07/20 1329 01/07/20 1414  BP: (!) 144/85 140/80  Pulse: 90 100  Resp: 18 18  Temp: 36.5 C   SpO2:      Last Pain:  Vitals:   01/08/20 0840  TempSrc:   PainSc: 4                  Lenard Simmer

## 2020-01-09 NOTE — Progress Notes (Signed)
01/09/2020 3:47 PM   Christina Sawyer March 05, 1961 [id number]  Referring provider: Concha Pyo, MD [address] [place],  [place] [address] Chief Complaint  Patient presents with   Post-op Follow-up    stent removal    HPI: Christina Sawyer is a 59 y.o. female who is seen 2 days post op left ureteroscopy on 01/07/2020 and stent removal.   She underwent a cystoscopy with insertion of left double-J stent on 12/29/19 with Dr. Annabell Howells.   Patient underwent a left ureteroscopy on 01/07/2020. Previously identified stone on CT now within the renal pelvis, no longer obstructing the infundibulum. Uncomplicated laser lithotripsy with basket extraction of stone fragment. Stent exchanged on tether.  Patient felt nervous removing the stent herself and came in office today for stent removal prior to visit. She is taking ibuprofen for pain. She is on Flomax. Overall she is doing well s/p procedure.  PMH: Past Medical History:  Diagnosis Date   Bladder incontinence    "interstim implant"- Right flank   Complication of anesthesia    "Stadol caused over sedation"   History of kidney stones    Hypercholesteremia    Kidney stone    at present-has stent in place.   PONV (postoperative nausea and vomiting)    Tuberculosis    "latent tuberculosis-tx. 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"    Surgical History: Past Surgical History:  Procedure Laterality Date   ANAL SPHINCTEROPLASTY N/A 12 2014   APPENDECTOMY     CHOLECYSTECTOMY     CYSTOSCOPY W/ URETERAL STENT PLACEMENT Left 04/15/2015   Procedure: CYSTOSCOPY WITH RETROGRADE PYELOGRAM/URETERAL STENT PLACEMENT;  Surgeon: Marcine Matar, MD;  Location: [facility];  Service: Urology;  Laterality: Left;   CYSTOSCOPY WITH STENT PLACEMENT Left 12/29/2019   Procedure: CYSTOSCOPY WITH STENT PLACEMENT;  Surgeon: Bjorn Pippin, MD;  Location: [facility];  Service: Urology;  Laterality: Left;   CYSTOSCOPY WITH URETEROSCOPY AND STENT PLACEMENT Left 05/22/2015   Procedure:  CYSTOSCOPY WITH URETEROSCOPY, STONE BASKETRY AND STENT EXCHANGE;  Surgeon: Marcine Matar, MD;  Location: [facility];  Service: Urology;  Laterality: Left;   CYSTOSCOPY/URETEROSCOPY/HOLMIUM LASER/STENT PLACEMENT Left 01/07/2020   Procedure: CYSTOSCOPY/URETEROSCOPY/HOLMIUM LASER/STENT PLACEMENT;  Surgeon: Vanna Scotland, MD;  Location: [facility];  Service: Urology;  Laterality: Left;   HOLMIUM LASER APPLICATION Left 05/22/2015   Procedure: HOLMIUM LASER APPLICATION;  Surgeon: Marcine Matar, MD;  Location: [facility];  Service: Urology;  Laterality: Left;   INTERSTIM IMPLANT PLACEMENT     TUBAL LIGATION      Home Medications:  Allergies as of 01/09/2020      Reactions   Butorphanol Other (See Comments)   "goes under""over sedation" Sensitivity      Medication List       Accurate as of January 09, 2020  3:47 PM. If you have any questions, ask your nurse or doctor.        atorvastatin 40 MG tablet Commonly known as: LIPITOR Take 40 mg by mouth at bedtime.   cephALEXin 500 MG capsule Commonly known as: KEFLEX Take 1 capsule (500 mg total) by mouth 3 (three) times daily for 12 days.   docusate sodium 100 MG capsule Commonly known as: COLACE Take 1 capsule (100 mg total) by mouth 2 (two) times daily.   ibuprofen 200 MG tablet Commonly known as: ADVIL Take 400 mg by mouth every 6 (six) hours as needed for moderate pain.   ondansetron 4 MG tablet Commonly known as: ZOFRAN Take 1 tablet (4 mg total) by mouth every 6 (six) hours  as needed for nausea.   oxyCODONE 5 MG immediate release tablet Commonly known as: Oxy IR/ROXICODONE Take 1 tablet (5 mg total) by mouth every 6 (six) hours as needed for severe pain.   solifenacin 10 MG tablet Commonly known as: VESICARE Take 1 tablet (10 mg total) by mouth every evening. Take 2 hours prior to bedtime   tamsulosin 0.4 MG Caps capsule Commonly known as: Flomax Take 1 capsule (0.4 mg total) by mouth daily after supper.   VAGISIL  EX Place 1 application vaginally daily as needed (dryness).       Allergies:  Allergies  Allergen Reactions   Butorphanol Other (See Comments)    "goes under""over sedation" Sensitivity     Family History: No family history on file.  Social History:  reports that she is a non-smoker but has been exposed to tobacco smoke. She has never used smokeless tobacco. She reports current alcohol use of about 1.0 standard drink of alcohol per week. She reports that she does not use drugs.   Physical Exam: BP 117/87    Pulse 97    Ht 5' 6.5" (1.689 m)    Wt 155 lb (70.3 kg)    BMI 24.64 kg/m   Constitutional:  Alert and oriented, No acute distress. HEENT: [place] AT, moist mucus membranes.  Trachea midline, no masses. Cardiovascular: No clubbing, cyanosis, or edema. Respiratory: Normal respiratory effort, no increased work of breathing. Skin: No rashes, bruises or suspicious lesions. Neurologic: Grossly intact, no focal deficits, moving all 4 extremities. Psychiatric: Normal mood and affect.  Laboratory Data:  Lab Results  Component Value Date   CREATININE 1.06 (H) 12/31/2019   Lab Results  Component Value Date   HGBA1C 6.0 (H) 12/30/2019    Assessment & Plan:    1. Left UPJ stone  S/p left ureteroscopy on 01/07/2020 Stent removed today in clinic without difficulty in entirety Discussed post op stent removal precautions.  Encourage patient to push fluids, take ibuprofen or pain prn and continue supportive care. Continue Flomax Will discuss stone diet in details during next appointment.  Follow up with renal ultrasound in 4 weeks.    [facility] [address] [place], [place] [address] [phone]  I, Theador Hawthorne, am acting as a scribe for Dr. Vanna Scotland.  I have reviewed the above documentation for accuracy and completeness, and I agree with the above.   Vanna Scotland, MD

## 2020-02-01 ENCOUNTER — Ambulatory Visit: Payer: No Typology Code available for payment source

## 2020-02-04 ENCOUNTER — Ambulatory Visit: Payer: No Typology Code available for payment source

## 2020-02-06 ENCOUNTER — Ambulatory Visit: Payer: Non-veteran care | Admitting: Urology

## 2020-02-11 ENCOUNTER — Ambulatory Visit
Admission: RE | Admit: 2020-02-11 | Discharge: 2020-02-11 | Disposition: A | Discharge status: Home / Self Care | Payer: No Typology Code available for payment source | Source: Ambulatory Visit | Attending: Urology | Admitting: Urology

## 2020-02-11 ENCOUNTER — Ambulatory Visit: Payer: No Typology Code available for payment source

## 2020-02-11 ENCOUNTER — Other Ambulatory Visit: Payer: Self-pay

## 2020-02-11 DIAGNOSIS — N200 Calculus of kidney: Secondary | ICD-10-CM | POA: Diagnosis not present

## 2020-02-13 ENCOUNTER — Other Ambulatory Visit: Payer: Self-pay

## 2020-02-13 ENCOUNTER — Encounter: Payer: Self-pay | Admitting: Urology

## 2020-02-13 ENCOUNTER — Ambulatory Visit (INDEPENDENT_AMBULATORY_CARE_PROVIDER_SITE_OTHER): Payer: No Typology Code available for payment source | Admitting: Urology

## 2020-02-13 VITALS — BP 142/85 | HR 83

## 2020-02-13 DIAGNOSIS — N3941 Urge incontinence: Secondary | ICD-10-CM

## 2020-02-13 DIAGNOSIS — N200 Calculus of kidney: Secondary | ICD-10-CM

## 2020-02-13 NOTE — Addendum Note (Signed)
Addended by: Milas Kocher A on: 02/13/2020 10:58 AM   Modules accepted: Orders

## 2020-02-13 NOTE — Progress Notes (Signed)
02/13/2020 9:46 AM   Christina Sawyer [birth year] [id number]  Referring provider: Concha Pyo, MD [address] [place],  [place] [address]  Chief Complaint  Patient presents with  . Follow-up    HPI: 59 y.o. female who presents today for follow-up following left ureteroscopy.  She underwent a cystoscopy with insertion of left double-J stenton 12/29/19 with Dr. Annabell Howells for a 9 mm left proximal obstructing ureteral calculus.  She has additional punctate stones bilaterally.  Patient underwent a left ureteroscopy on 01/07/2020. Previously identified stone on CT now within the renal pelvis, no longer obstructing the infundibulum. Uncomplicated laser lithotripsy with basket extraction of stone fragment. Stent exchanged on tether.  She returned to the office 2 days later to have her stent removed.  Follow-up renal ultrasound on 02/11/2020 was unremarkable.  No hydronephrosis or significant stone burden bilaterally.  Incidental fatty liver appreciated.  She does have a personal history of stones.  She underwent ureteroscopy back in 2017 with a similar presentation.  She also recently had ureteroscopy at the [facility] at which time ureteroscopy was negative.  [name]  She also mentions today that she has been having increased urinary frequency and urge incontinence exacerbated by the stent.  She notes that she was followed by urogynecology in the past and has an InterStim that was placed in 2017 at [facility].  This was primarily for bowel symptoms.  She wonders if her InterStim settings could possibly be utilized or optimized to help her with her urinary urgency and urge incontinence.  She is being referred back to pelvic floor therapy starting next month which is helped in the past.  She denies any dysuria or gross hematuria.  PMH: Past Medical History:  Diagnosis Date  . Bladder incontinence    "interstim implant"- Right flank  . Complication of  anesthesia    "Stadol caused over sedation"  . History of kidney stones   . Hypercholesteremia   . Kidney stone    at present-has stent in place.  Marland Kitchen PONV (postoperative nausea and vomiting)   . Tuberculosis    "latent tuberculosis-tx. 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"    Surgical History: Past Surgical History:  Procedure Laterality Date  . ANAL SPHINCTEROPLASTY N/A 12 2014  . APPENDECTOMY    . CHOLECYSTECTOMY    . CYSTOSCOPY W/ URETERAL STENT PLACEMENT Left 04/15/2015   Procedure: CYSTOSCOPY WITH RETROGRADE PYELOGRAM/URETERAL STENT PLACEMENT;  Surgeon: Marcine Matar, MD;  Location: [facility];  Service: Urology;  Laterality: Left;  . CYSTOSCOPY WITH STENT PLACEMENT Left 12/29/2019   Procedure: CYSTOSCOPY WITH STENT PLACEMENT;  Surgeon: Bjorn Pippin, MD;  Location: [facility];  Service: Urology;  Laterality: Left;  . CYSTOSCOPY WITH URETEROSCOPY AND STENT PLACEMENT Left 05/22/2015   Procedure: CYSTOSCOPY WITH URETEROSCOPY, STONE BASKETRY AND STENT EXCHANGE;  Surgeon: Marcine Matar, MD;  Location: [facility];  Service: Urology;  Laterality: Left;  . CYSTOSCOPY/URETEROSCOPY/HOLMIUM LASER/STENT PLACEMENT Left 01/07/2020   Procedure: CYSTOSCOPY/URETEROSCOPY/HOLMIUM LASER/STENT PLACEMENT;  Surgeon: Vanna Scotland, MD;  Location: [facility];  Service: Urology;  Laterality: Left;  . HOLMIUM LASER APPLICATION Left 05/22/2015   Procedure: HOLMIUM LASER APPLICATION;  Surgeon: Marcine Matar, MD;  Location: [facility];  Service: Urology;  Laterality: Left;  . INTERSTIM IMPLANT PLACEMENT    . TUBAL LIGATION      Home Medications:  Allergies as of 02/13/2020      Reactions   Butorphanol Other (See Comments)   "goes under""over sedation" Sensitivity      Medication  List       Accurate as of February 13, 2020  9:46 AM. If you have any questions, ask your nurse or doctor.        STOP taking these medications   docusate sodium 100 MG capsule Commonly known as: COLACE Stopped by: Vanna Scotland, MD     ondansetron 4 MG tablet Commonly known as: ZOFRAN Stopped by: Vanna Scotland, MD   oxyCODONE 5 MG immediate release tablet Commonly known as: Oxy IR/ROXICODONE Stopped by: Vanna Scotland, MD   tamsulosin 0.4 MG Caps capsule Commonly known as: Flomax Stopped by: Vanna Scotland, MD     TAKE these medications   atorvastatin 40 MG tablet Commonly known as: LIPITOR Take 40 mg by mouth at bedtime.   ibuprofen 200 MG tablet Commonly known as: ADVIL Take 400 mg by mouth every 6 (six) hours as needed for moderate pain.   solifenacin 10 MG tablet Commonly known as: VESICARE Take 1 tablet (10 mg total) by mouth every evening. Take 2 hours prior to bedtime   VAGISIL EX Place 1 application vaginally daily as needed (dryness).       Allergies:  Allergies  Allergen Reactions  . Butorphanol Other (See Comments)    "goes under""over sedation" Sensitivity     Family History: No family history on file.  Social History:  reports that she is a non-smoker but has been exposed to tobacco smoke. She has never used smokeless tobacco. She reports current alcohol use of about 1.0 standard drink of alcohol per week. She reports that she does not use drugs.   Physical Exam: BP (!) 142/85   Pulse 83   Constitutional:  Alert and oriented, No acute distress. HEENT: [place] AT, moist mucus membranes.  Trachea midline, no masses. Cardiovascular: No clubbing, cyanosis, or edema. Respiratory: Normal respiratory effort, no increased work of breathing. GI: Abdomen is soft, nontender, nondistended, no abdominal masses Skin: No rashes, bruises or suspicious lesions. Neurologic: Grossly intact, no focal deficits, moving all 4 extremities. Psychiatric: Normal mood and affect.   Pertinent Imaging: Ultrasound renal complete  Narrative CLINICAL DATA:  59 year old female with left kidney stone.  EXAM: RENAL / URINARY TRACT ULTRASOUND COMPLETE  COMPARISON:  CT abdomen pelvis dated  12/28/2019.  FINDINGS: Right Kidney:  Renal measurements: 10.1 x 6.1 x 6.2 cm = volume: 200 mL. Normal echogenicity. Multiple nonobstructing renal calculi measure up to 9 mm in the interpolar kidney. No hydronephrosis.  Left Kidney:  Renal measurements: 11.0 x 5.3 x 5.0 cm = volume: 153 mL. Normal echogenicity. Multiple nonobstructing calculi measure up to 6 mm in the lower pole. No hydronephrosis or obstructing stone.  Bladder:  Appears normal for degree of bladder distention. Bilateral ureteral jets noted.  Other:  There is diffuse increased liver echogenicity most commonly seen in the setting of fatty infiltration. Superimposed inflammation or fibrosis is not excluded. Clinical correlation is recommended.  IMPRESSION: 1. Nonobstructing bilateral renal calculi.  No hydronephrosis. 2. Fatty liver.   Electronically Signed By: Elgie Collard M.D. On: 02/11/2020 15:49  Renal ultrasound was personally reviewed.  Agree with radiologic interpretation.  Assessment & Plan:    1. Nephrolithiasis Status post uncomplicated left ureteroscopy-stone was quite soft possibly a calcified clot from previous recent surgery  We discussed general stone prevention techniques including drinking plenty water with goal of producing 2.5 L urine daily, increased citric acid intake, avoidance of high oxalate containing foods, and decreased salt intake.  Information about dietary recommendations given today.  We will  plan to hold off on 24-hour metabolic evaluation given relatively infrequent stone episodes may consider for the future if cadence increases  Renal ultrasound is unremarkable, no hydronephrosis or significant residual stone burden  2. Urge incontinence Lengthy discussion today about her worsening urinary urgency and urge incontinence  Plan for UA/urine culture will infection although not suspected based on absence of other symptoms.  She will bring her urine tomorrow as she just  voided today.  She is interested to know whether or not her InterStim can be adjusted all to help manage her urinary symptoms.  I explained that I have no expertise in this but have referred her to my partner, Dr. Sherron Monday for further consideration.  Review of notes from [facility] indicate that the InterStim was placed in 2017 and her available to [place] for review.  Agree with resumption of pelvic floor therapy  F/u in 2-3 weeks with Dr. Janna Arch, MD  [facility] [address], [address] [place], [place] [address] [phone]

## 2020-02-14 ENCOUNTER — Other Ambulatory Visit: Payer: No Typology Code available for payment source

## 2020-02-14 DIAGNOSIS — N200 Calculus of kidney: Secondary | ICD-10-CM

## 2020-02-14 DIAGNOSIS — N3941 Urge incontinence: Secondary | ICD-10-CM

## 2020-02-15 LAB — MICROSCOPIC EXAMINATION

## 2020-02-15 LAB — URINALYSIS, COMPLETE
Bilirubin, UA: NEGATIVE
Glucose, UA: NEGATIVE
Ketones, UA: NEGATIVE
Leukocytes,UA: NEGATIVE
Nitrite, UA: POSITIVE — AB
Protein,UA: NEGATIVE
RBC, UA: NEGATIVE
Specific Gravity, UA: >1.03 — ABNORMAL HIGH (ref 1.005–1.030)
Urobilinogen, Ur: 0.2 mg/dL (ref 0.2–1.0)
pH, UA: 6.5 (ref 5.0–7.5)

## 2020-02-18 ENCOUNTER — Telehealth: Payer: Self-pay | Admitting: *Deleted

## 2020-02-18 LAB — CULTURE, URINE COMPREHENSIVE

## 2020-02-18 NOTE — Telephone Encounter (Addendum)
Left VM with details as instructed, Asked to return call to verify which pharmacy to send RX.  ----- Message from Vanna Scotland, MD sent at 02/18/2020  2:31 PM EST ----- Ms. Timberlake did end up growing bacteria in her urine.  I like to prescribe her Bactrim DS twice daily for 5 days.  Vanna Scotland, MD

## 2020-02-20 MED ORDER — SULFAMETHOXAZOLE-TRIMETHOPRIM 800-160 MG PO TABS: 1.0000 | ORAL_TABLET | Freq: Two times a day (BID) | ORAL | 0 refills | Status: DC

## 2020-02-20 NOTE — Telephone Encounter (Signed)
Patient returned call, sent in Bactrim to [facility] as requested. Answered all questions, voiced understanding.

## 2020-03-03 ENCOUNTER — Other Ambulatory Visit: Payer: Self-pay

## 2020-03-03 ENCOUNTER — Ambulatory Visit (INDEPENDENT_AMBULATORY_CARE_PROVIDER_SITE_OTHER): Payer: No Typology Code available for payment source | Admitting: Urology

## 2020-03-03 VITALS — BP 138/95 | HR 79 | Wt 155.0 lb

## 2020-03-03 DIAGNOSIS — N3946 Mixed incontinence: Secondary | ICD-10-CM | POA: Diagnosis not present

## 2020-03-03 DIAGNOSIS — N200 Calculus of kidney: Secondary | ICD-10-CM

## 2020-03-03 LAB — URINALYSIS, COMPLETE
Bilirubin, UA: NEGATIVE
Glucose, UA: NEGATIVE
Ketones, UA: NEGATIVE
Nitrite, UA: NEGATIVE
Protein,UA: NEGATIVE
RBC, UA: NEGATIVE
Specific Gravity, UA: >1.03 — ABNORMAL HIGH (ref 1.005–1.030)
Urobilinogen, Ur: 0.2 mg/dL (ref 0.2–1.0)
pH, UA: 5.5 (ref 5.0–7.5)

## 2020-03-03 LAB — MICROSCOPIC EXAMINATION

## 2020-03-03 NOTE — Progress Notes (Signed)
03/03/2020 9:10 AM   Christina Sawyer 14-Feb-1961 [id number]  Referring provider: Concha Pyo, MD [address] [place],  [place] [address]  Chief Complaint  Patient presents with  . Nephrolithiasis    HPI: Dr Apolinar Junes:   Patient had recent ureteroscopy Dr. Apolinar Junes.  She has an InterStim placed 2017 in [place].  She has been referred pelvic floor therapy.  The patient had InterStim in 2017 at [facility] for fecal incontinence after a sphincteroplasty.  She would lose control of stool with no sensation.  The InterStim device helped this as well.  She reports that since her last kidney stone was removed her bladder is more urgent and now she is having urgency incontinence.  When she feels urgency she can also have a bowel accident.  She is on Vesicare as a partial responder.  She is voiding every hour and getting up every 2 hours at night with some foot on the floor syndrome.  She reports nerve damage affecting her bowel  She is recently being treated for a bladder infection on Bactrim once a day and not taking it twice a day for fear of diarrhea.  I do not think she is tried any other antimuscarinics in the past or beta 3 agonists but I believe when she saw physical therapy many months ago she describes having an overactive bladder working on urge suppression techniques then.  She is just tired and see physical therapy again now  No previous bladder surgery.  Has had multiple kidney stones.     PMH: Past Medical History:  Diagnosis Date  . Bladder incontinence    "interstim implant"- Right flank  . Complication of anesthesia    "Stadol caused over sedation"  . History of kidney stones   . Hypercholesteremia   . Kidney stone    at present-has stent in place.  Marland Kitchen PONV (postoperative nausea and vomiting)   . Tuberculosis    "latent tuberculosis-tx. 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"    Surgical History: Past Surgical History:  Procedure Laterality Date  . ANAL SPHINCTEROPLASTY N/A 12 2014   . APPENDECTOMY    . CHOLECYSTECTOMY    . CYSTOSCOPY W/ URETERAL STENT PLACEMENT Left 04/15/2015   Procedure: CYSTOSCOPY WITH RETROGRADE PYELOGRAM/URETERAL STENT PLACEMENT;  Surgeon: Marcine Matar, MD;  Location: [facility];  Service: Urology;  Laterality: Left;  . CYSTOSCOPY WITH STENT PLACEMENT Left 12/29/2019   Procedure: CYSTOSCOPY WITH STENT PLACEMENT;  Surgeon: Bjorn Pippin, MD;  Location: [facility];  Service: Urology;  Laterality: Left;  . CYSTOSCOPY WITH URETEROSCOPY AND STENT PLACEMENT Left 05/22/2015   Procedure: CYSTOSCOPY WITH URETEROSCOPY, STONE BASKETRY AND STENT EXCHANGE;  Surgeon: Marcine Matar, MD;  Location: [facility];  Service: Urology;  Laterality: Left;  . CYSTOSCOPY/URETEROSCOPY/HOLMIUM LASER/STENT PLACEMENT Left 01/07/2020   Procedure: CYSTOSCOPY/URETEROSCOPY/HOLMIUM LASER/STENT PLACEMENT;  Surgeon: Vanna Scotland, MD;  Location: [facility];  Service: Urology;  Laterality: Left;  . HOLMIUM LASER APPLICATION Left 05/22/2015   Procedure: HOLMIUM LASER APPLICATION;  Surgeon: Marcine Matar, MD;  Location: [facility];  Service: Urology;  Laterality: Left;  . INTERSTIM IMPLANT PLACEMENT    . TUBAL LIGATION      Home Medications:  Allergies as of 03/03/2020      Reactions   Butorphanol Other (See Comments)   "goes under""over sedation" Sensitivity      Medication List       Accurate as of March 03, 2020  9:10 AM. If you have any questions, ask your nurse or doctor.  STOP taking these medications   sulfamethoxazole-trimethoprim 800-160 MG tablet Commonly known as: BACTRIM DS Stopped by: Martina Sinner, MD     TAKE these medications   atorvastatin 40 MG tablet Commonly known as: LIPITOR Take 40 mg by mouth at bedtime.   ibuprofen 200 MG tablet Commonly known as: ADVIL Take 400 mg by mouth every 6 (six) hours as needed for moderate pain.   solifenacin 10 MG tablet Commonly known as: VESICARE Take 1 tablet (10 mg total) by mouth every evening. Take 2  hours prior to bedtime   VAGISIL EX Place 1 application vaginally daily as needed (dryness).       Allergies:  Allergies  Allergen Reactions  . Butorphanol Other (See Comments)    "goes under""over sedation" Sensitivity     Family History: No family history on file.  Social History:  reports that she is a non-smoker but has been exposed to tobacco smoke. She has never used smokeless tobacco. She reports current alcohol use of about 1.0 standard drink of alcohol per week. She reports that she does not use drugs.  ROS:                                        Physical Exam: There were no vitals taken for this visit.  Constitutional:  Alert and oriented, No acute distress. HEENT: [place] AT, moist mucus membranes.  Trachea midline, no masses. Cardiovascular: No clubbing, cyanosis, or edema. Respiratory: Normal respiratory effort, no increased work of breathing. GI: Abdomen is soft, nontender, nondistended, no abdominal masses GU: No CVA tenderness.  Skin: No rashes, bruises or suspicious lesions. Lymph: No cervical or inguinal adenopathy. Neurologic: Grossly intact, no focal deficits, moving all 4 extremities. Psychiatric: Normal mood and affect.  Laboratory Data: Lab Results  Component Value Date   WBC 9.3 12/31/2019   HGB 11.9 (L) 12/31/2019   HCT 36.0 12/31/2019   MCV 88.9 12/31/2019   PLT 120 (L) 12/31/2019    Lab Results  Component Value Date   CREATININE 1.06 (H) 12/31/2019    No results found for: PSA  No results found for: TESTOSTERONE  Lab Results  Component Value Date   HGBA1C 6.0 (H) 12/30/2019    Urinalysis    Component Value Date/Time   COLORURINE YELLOW (A) 12/29/2019 0324   APPEARANCEUR Cloudy (A) 02/14/2020 1112   LABSPEC 1.021 12/29/2019 0324   PHURINE 6.0 12/29/2019 0324   GLUCOSEU Negative 02/14/2020 1112   HGBUR MODERATE (A) 12/29/2019 0324   BILIRUBINUR Negative 02/14/2020 1112   KETONESUR NEGATIVE 12/29/2019  0324   PROTEINUR Negative 02/14/2020 1112   PROTEINUR 30 (A) 12/29/2019 0324   NITRITE Positive (A) 02/14/2020 1112   NITRITE POSITIVE (A) 12/29/2019 0324   LEUKOCYTESUR Negative 02/14/2020 1112   LEUKOCYTESUR LARGE (A) 12/29/2019 0324    Pertinent Imaging:   Assessment & Plan: Patient and I had a lengthy discussion regarding her InterStim device.  Chart reviewed.  Urine reviewed.  Urine sent for culture.  She understands that the fecal sensation she now is gets with the InterStim that any adjustments to the device would be done by her physician from [facility].  She understands physical therapy could greatly help her bladder symptoms as it did before but is more severe now.  Call if urine culture is positive.  She did not want to try different medications for the urge incontinence yet.  Reassess in 8 weeks.  I explained to her how the device works for bowel and bladder and its the same programming steps.  She knows to change programs and amplitude  There are no diagnoses linked to this encounter.  No follow-ups on file.  Martina Sinner, MD  [facility] [address] [place], [place] [address] [phone]

## 2020-03-09 LAB — CULTURE, URINE COMPREHENSIVE

## 2020-04-28 ENCOUNTER — Ambulatory Visit: Payer: Self-pay | Admitting: Urology

## 2020-05-02 ENCOUNTER — Encounter: Payer: Self-pay | Admitting: Urology

## 2021-07-03 IMAGING — US US RENAL
1 series · 14 of 25 positions shown · non-contrast
Comparison: CT abdomen pelvis dated 12/28/2019.

CLINICAL DATA: 59-year-old female with left kidney stone.

EXAM:
RENAL / URINARY TRACT ULTRASOUND COMPLETE

[Series 1: us renal · 14 of 49 slices shown]
[im 1/49]
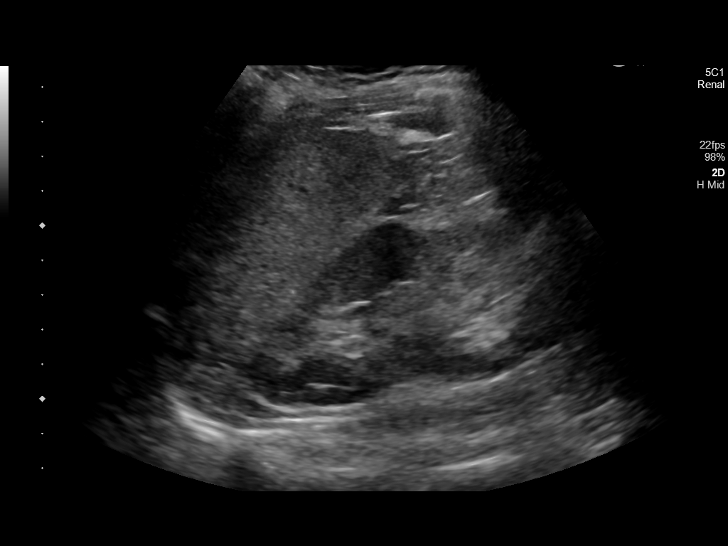
[im 5/49]
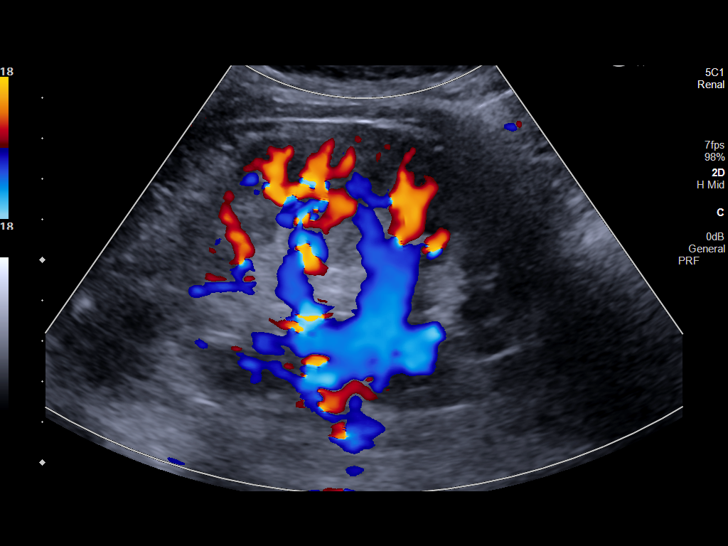
[im 9/49]
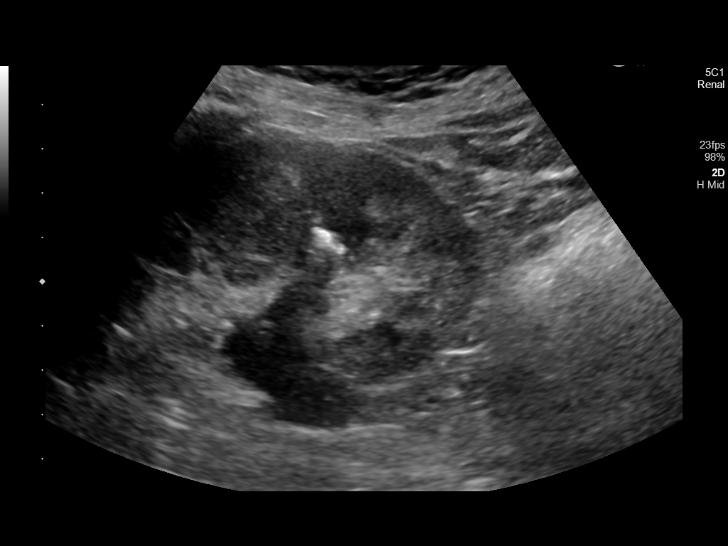
[im 13/49]
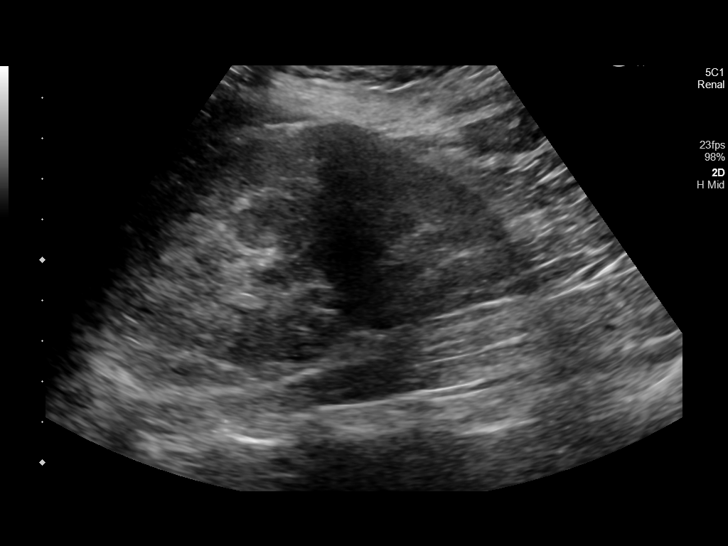
[im 17/49]
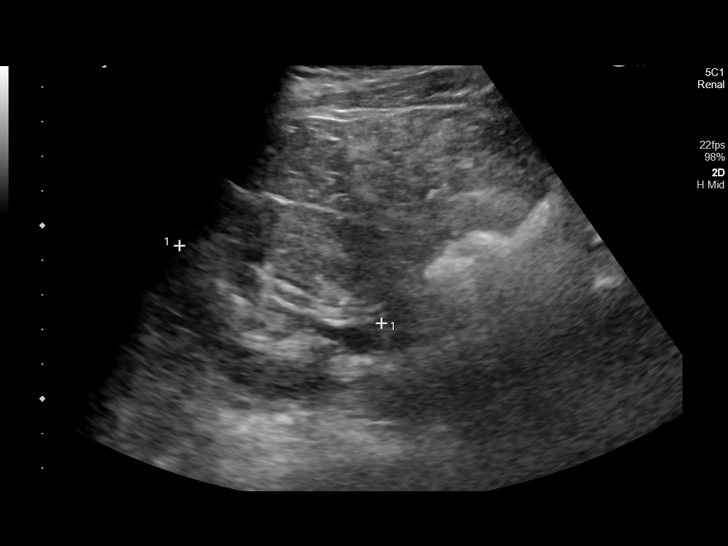
[im 19/49]
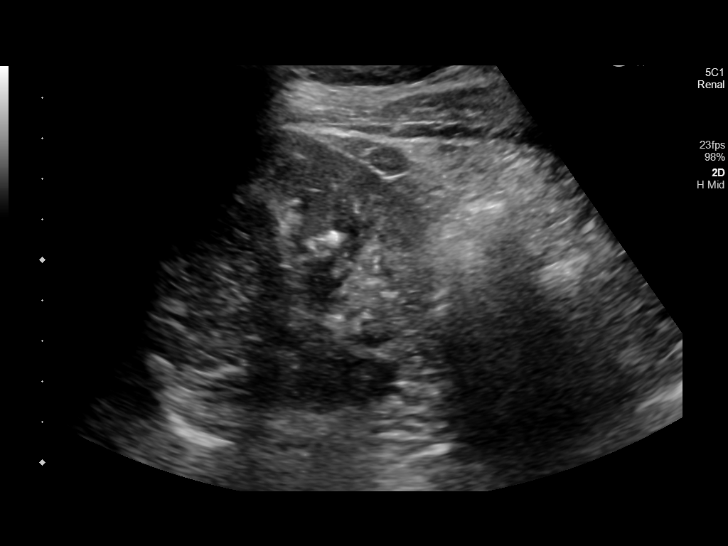
[im 23/49]
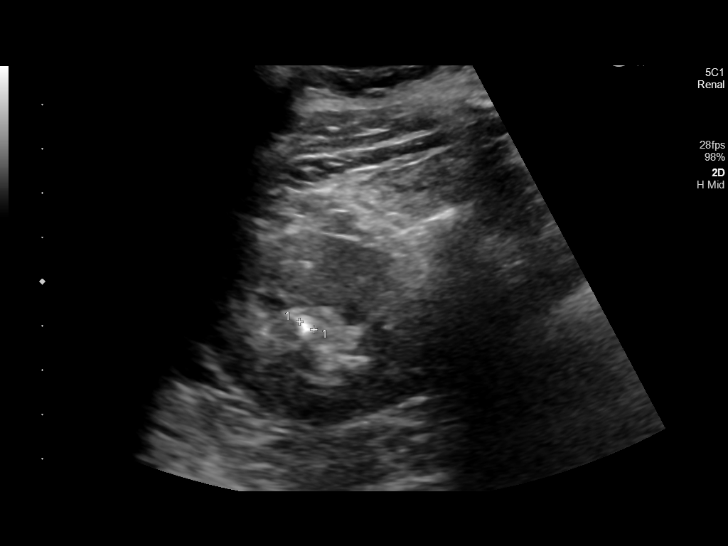
[im 27/49]
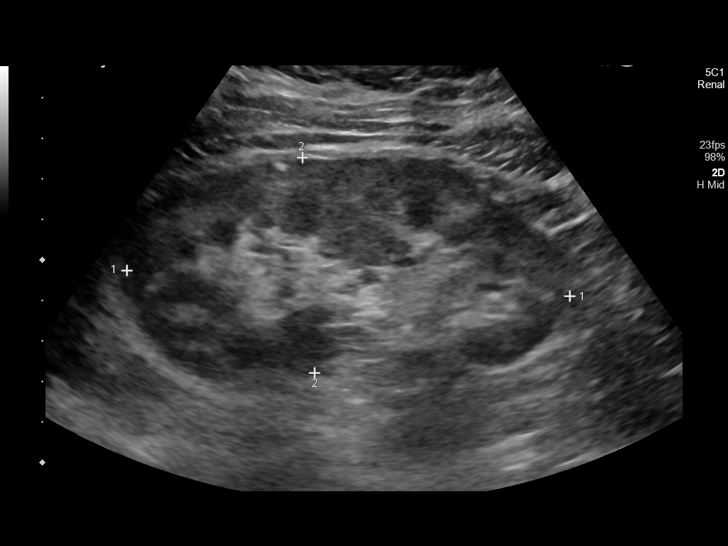
[im 31/49]
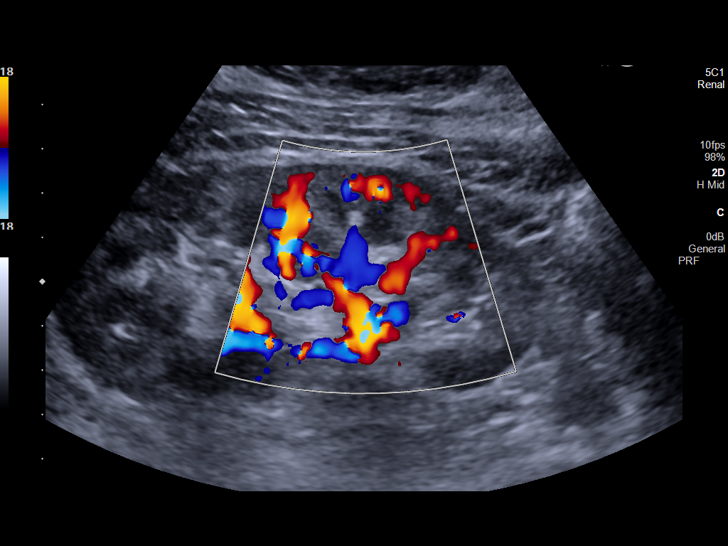
[im 33/49]
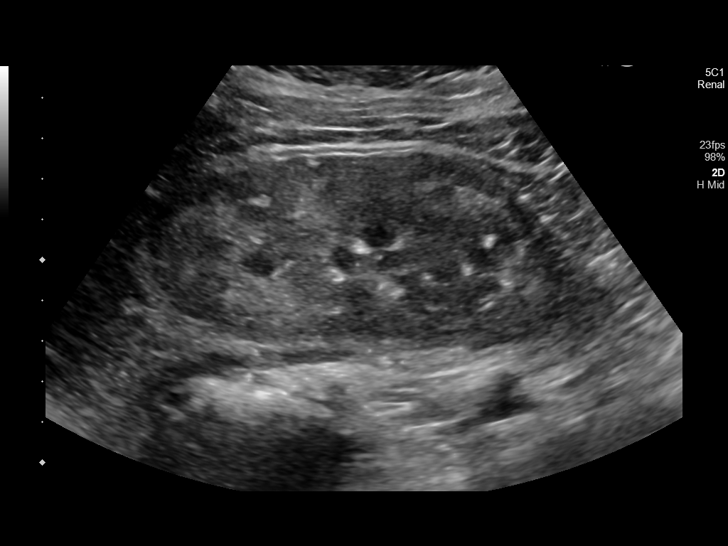
[im 37/49]
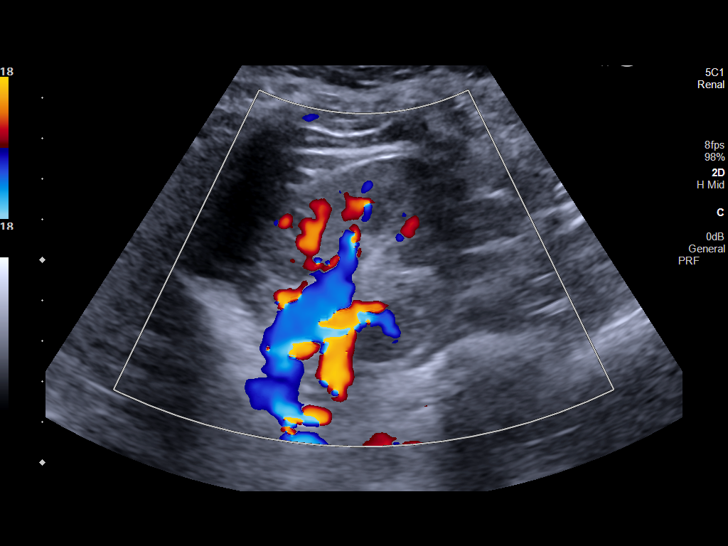
[im 41/49]
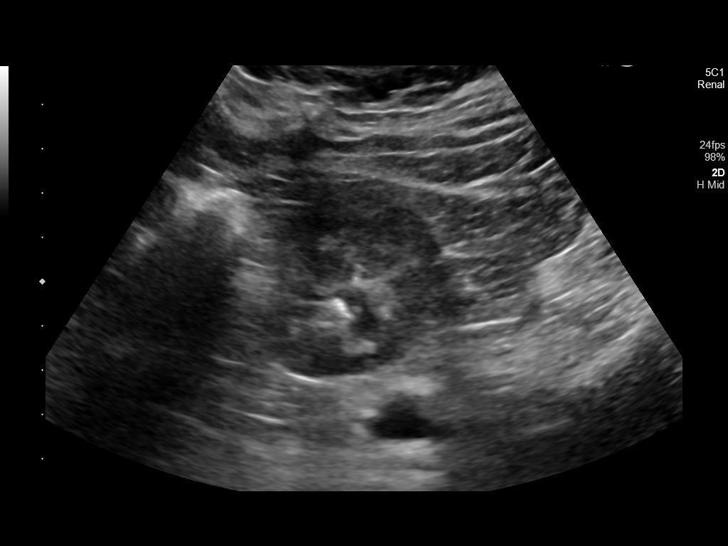
[im 45/49]
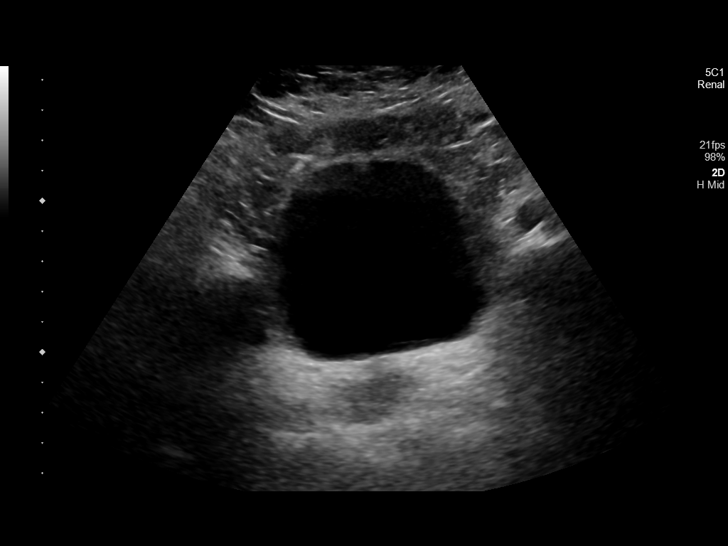
[im 49/49]
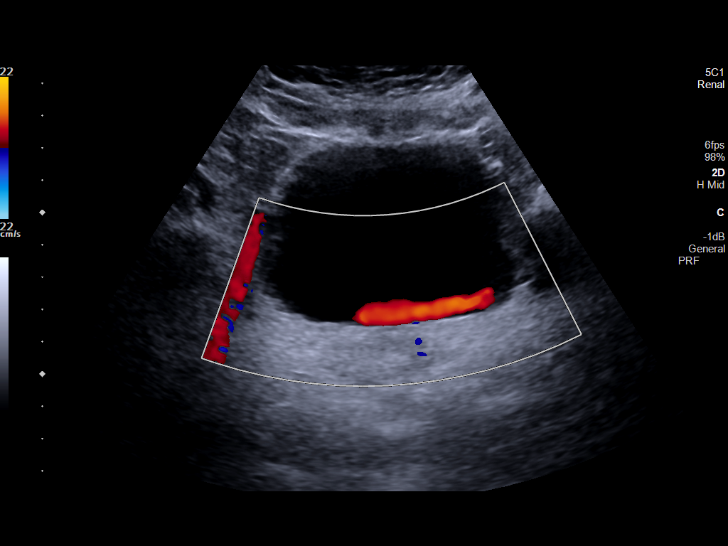

[14 of 25 positions shown; findings below may reference images not displayed]

FINDINGS: Right Kidney:

Renal measurements: 10.1 x 6.1 x 6.2 cm = volume: 200 mL. Normal
echogenicity. Multiple nonobstructing renal calculi measure up to 9
mm in the interpolar kidney. No hydronephrosis.

Left Kidney:

Renal measurements: 11.0 x 5.3 x 5.0 cm = volume: 153 mL. Normal
echogenicity. Multiple nonobstructing calculi measure up to 6 mm in
the lower pole. No hydronephrosis or obstructing stone.

Bladder:

Appears normal for degree of bladder distention. Bilateral ureteral
jets noted.

Other:

There is diffuse increased liver echogenicity most commonly seen in
the setting of fatty infiltration. Superimposed inflammation or
fibrosis is not excluded. Clinical correlation is recommended.
IMPRESSION: 1. Nonobstructing bilateral renal calculi.  No hydronephrosis.
2. Fatty liver.

## 2021-10-29 ENCOUNTER — Encounter: Payer: Self-pay | Admitting: *Deleted

## 2021-10-29 ENCOUNTER — Emergency Department: Payer: No Typology Code available for payment source

## 2021-10-29 ENCOUNTER — Other Ambulatory Visit: Payer: Self-pay

## 2021-10-29 DIAGNOSIS — Z20822 Contact with and (suspected) exposure to covid-19: Secondary | ICD-10-CM | POA: Insufficient documentation

## 2021-10-29 DIAGNOSIS — B9789 Other viral agents as the cause of diseases classified elsewhere: Secondary | ICD-10-CM | POA: Insufficient documentation

## 2021-10-29 DIAGNOSIS — J069 Acute upper respiratory infection, unspecified: Secondary | ICD-10-CM | POA: Insufficient documentation

## 2021-10-29 DIAGNOSIS — R0602 Shortness of breath: Secondary | ICD-10-CM | POA: Diagnosis present

## 2021-10-29 LAB — TROPONIN I (HIGH SENSITIVITY): Troponin I (High Sensitivity): 6 ng/L (ref <18)

## 2021-10-29 LAB — BASIC METABOLIC PANEL
Anion gap: 8 (ref 5–15)
BUN: 20 mg/dL (ref 6–20)
CO2: 26 mmol/L (ref 22–32)
Calcium: 10 mg/dL (ref 8.9–10.3)
Chloride: 106 mmol/L (ref 98–111)
Creatinine, Ser: 0.88 mg/dL (ref 0.44–1.00)
GFR, Estimated: >60 mL/min (ref >60)
Glucose, Bld: 123 mg/dL — ABNORMAL HIGH (ref 70–99)
Potassium: 4.2 mmol/L (ref 3.5–5.1)
Sodium: 140 mmol/L (ref 135–145)

## 2021-10-29 LAB — CBC
HCT: 42.1 % (ref 36.0–46.0)
Hemoglobin: 13.6 g/dL (ref 12.0–15.0)
MCH: 28.5 pg (ref 26.0–34.0)
MCHC: 32.3 g/dL (ref 30.0–36.0)
MCV: 88.1 fL (ref 80.0–100.0)
Platelets: 169 10*3/uL (ref 150–400)
RBC: 4.78 MIL/uL (ref 3.87–5.11)
RDW: 11.9 % (ref 11.5–15.5)
WBC: 7.7 10*3/uL (ref 4.0–10.5)
nRBC: 0 % (ref 0.0–0.2)

## 2021-10-29 NOTE — ED Triage Notes (Signed)
Pt has sore throat, cough .  Pt has chills.  Nonsmoker.  No chest pain  pt reports intermittent sob.  Pt alert  speech clear.

## 2021-10-30 ENCOUNTER — Emergency Department
Admission: EM | Admit: 2021-10-30 | Discharge: 2021-10-30 | Disposition: A | Discharge status: Home / Self Care | Payer: No Typology Code available for payment source | Attending: Emergency Medicine | Admitting: Emergency Medicine

## 2021-10-30 DIAGNOSIS — J069 Acute upper respiratory infection, unspecified: Secondary | ICD-10-CM

## 2021-10-30 LAB — SARS CORONAVIRUS 2 BY RT PCR: SARS Coronavirus 2 by RT PCR: NEGATIVE

## 2021-10-30 LAB — GROUP A STREP BY PCR: Group A Strep by PCR: NOT DETECTED

## 2021-10-30 MED ORDER — BENZONATATE 100 MG PO CAPS: 100.0000 mg | ORAL_CAPSULE | Freq: Three times a day (TID) | ORAL | 0 refills | Status: DC | PRN

## 2021-10-30 MED ORDER — DEXAMETHASONE SODIUM PHOSPHATE 10 MG/ML IJ SOLN
6.0000 mg | Freq: Once | INTRAMUSCULAR | Status: AC
Start: 2021-10-30 — End: 2021-10-30
  Administered 2021-10-30: 6 mg via INTRAMUSCULAR
  Filled 2021-10-30: qty 1

## 2021-10-30 MED ORDER — ALBUTEROL SULFATE HFA 108 (90 BASE) MCG/ACT IN AERS: 2.0000 | INHALATION_SPRAY | Freq: Four times a day (QID) | RESPIRATORY_TRACT | 2 refills | Status: DC | PRN

## 2021-10-30 MED ORDER — ALBUTEROL SULFATE HFA 108 (90 BASE) MCG/ACT IN AERS
2.0000 | INHALATION_SPRAY | Freq: Four times a day (QID) | RESPIRATORY_TRACT | 2 refills | Status: DC | PRN
Start: 2021-10-30 — End: 2021-10-30

## 2021-10-30 NOTE — ED Provider Notes (Signed)
[facility] Provider Note    Event Date/Time   First MD Initiated Contact with Patient 10/30/21 0231     (approximate)   History   Sore Throat and Cough   HPI  Christina Sawyer is a 61 y.o. female  who presents to the emergency department today because of concern for cough, shortness of breath, sore throat, muscle aches. Symptoms started two days ago. Patient tried some sore throat lozanges which temporarily helped with the sore throat. The patient was at work today when she started feeling more short of breath. Patient denies any known COVID contacts.       Physical Exam   Triage Vital Signs: ED Triage Vitals  Enc Vitals Group     BP 10/29/21 2238 (!) 147/116     Pulse Rate 10/29/21 2238 (!) 108     Resp 10/29/21 2238 20     Temp 10/29/21 2238 98.6 F (37 C)     Temp Source 10/29/21 2238 Oral     SpO2 10/29/21 2238 95 %     Weight 10/29/21 2236 150 lb (68 kg)     Height 10/29/21 2236 5\' 6"  (1.676 m)     Head Circumference --      Peak Flow --      Pain Score 10/29/21 2236 5     Pain Loc --      Pain Edu? --      Excl. in GC? --     Most recent vital signs: Vitals:   10/29/21 2238  BP: (!) 147/116  Pulse: (!) 108  Resp: 20  Temp: 98.6 F (37 C)  SpO2: 95%    General: Awake, alert, oriented. CV:  Good peripheral perfusion. Regular rate and rhythm. Resp:  Normal effort. Lungs clear. Abd:  No distention. Non tender.   ED Results / Procedures / Treatments   Labs (all labs ordered are listed, but only abnormal results are displayed) Labs Reviewed  BASIC METABOLIC PANEL - Abnormal; Notable for the following components:      Result Value   Glucose, Bld 123 (*)    All other components within normal limits  SARS CORONAVIRUS 2 BY RT PCR  GROUP A STREP BY PCR  CBC  TROPONIN I (HIGH SENSITIVITY)  TROPONIN I (HIGH SENSITIVITY)     EKG  I, 10/31/21, attending physician, personally viewed and interpreted this EKG  EKG Time:  2246 Rate: 107 Rhythm: sinus tachycardiaa Axis: left axis deviation Intervals: qtc 448 QRS: LAFB ST changes: no st elevation Impression: abnormal ekg   RADIOLOGY I independently interpreted and visualized the CXR. My interpretation: No pneumonia. No pneumothorax.  Radiology interpretation:  IMPRESSION:  Mild bronchial wall thickening, likely infectious/inflammatory.     PROCEDURES:  Critical Care performed: No  Procedures   MEDICATIONS ORDERED IN ED: Medications - No data to display   IMPRESSION / MDM / ASSESSMENT AND PLAN / ED COURSE  I reviewed the triage vital signs and the nursing notes.                              Differential diagnosis includes, but is not limited to, COVID, other viral URI, strep throat.  Patient's presentation is most consistent with acute presentation with potential threat to life or bodily function.  Patient presented to the emergency department today because of concern for viral URI type symptoms. COVID and strep negative here. CXR without evidence of pneumonia.  At this time I do think viral URI likely. Discussed this with the patient. Will give dose of steroids to help with both sore throat and breathing difficulty. Discussed symptomatic care with patient.   FINAL CLINICAL IMPRESSION(S) / ED DIAGNOSES   Final diagnoses:  Viral upper respiratory tract infection      Note:  This document was prepared using Dragon voice recognition software and may include unintentional dictation errors.    Phineas Semen, MD 10/30/21 [phone]

## 2021-10-30 NOTE — Discharge Instructions (Signed)
Please seek medical attention for any high fevers, chest pain, shortness of breath, change in behavior, persistent vomiting, bloody stool or any other new or concerning symptoms.  

## 2021-10-31 ENCOUNTER — Telehealth: Payer: Self-pay | Admitting: Emergency Medicine

## 2021-10-31 MED ORDER — ALBUTEROL SULFATE HFA 108 (90 BASE) MCG/ACT IN AERS: 2.0000 | INHALATION_SPRAY | Freq: Four times a day (QID) | RESPIRATORY_TRACT | 0 refills | Status: AC | PRN

## 2021-10-31 MED ORDER — BENZONATATE 100 MG PO CAPS: 100.0000 mg | ORAL_CAPSULE | Freq: Three times a day (TID) | ORAL | 0 refills | Status: AC | PRN

## 2021-10-31 NOTE — Telephone Encounter (Signed)
-----------------------------------------   11:33 AM on 10/31/21 ----------------------------------------- Patient called the emergency department stating that the pharmacy her prescriptions were sent to is closed today.  She is requesting printed prescription that she can take to the [facility] to have the medications filled.  We will provide her with printed prescriptions for Tessalon and albuterol.

## 2023-06-03 ENCOUNTER — Other Ambulatory Visit: Payer: Self-pay | Admitting: Orthopedic Surgery

## 2023-06-03 DIAGNOSIS — M25511 Pain in right shoulder: Secondary | ICD-10-CM

## 2023-06-08 ENCOUNTER — Ambulatory Visit: Admission: RE | Admit: 2023-06-08 | Source: Ambulatory Visit

## 2023-06-09 ENCOUNTER — Ambulatory Visit
Admission: RE | Admit: 2023-06-09 | Discharge: 2023-06-09 | Disposition: A | Discharge status: Home / Self Care | Source: Ambulatory Visit | Attending: Orthopedic Surgery | Admitting: Orthopedic Surgery

## 2023-06-09 DIAGNOSIS — M25511 Pain in right shoulder: Secondary | ICD-10-CM | POA: Insufficient documentation

## 2023-10-19 ENCOUNTER — Inpatient Hospital Stay
Admission: EM | Admit: 2023-10-19 | Discharge: 2023-10-21 | DRG: 872 | Disposition: A | Discharge status: Home / Self Care | Attending: Internal Medicine | Admitting: Internal Medicine

## 2023-10-19 ENCOUNTER — Emergency Department

## 2023-10-19 ENCOUNTER — Other Ambulatory Visit: Payer: Self-pay

## 2023-10-19 DIAGNOSIS — Z9049 Acquired absence of other specified parts of digestive tract: Secondary | ICD-10-CM

## 2023-10-19 DIAGNOSIS — Z79899 Other long term (current) drug therapy: Secondary | ICD-10-CM

## 2023-10-19 DIAGNOSIS — E785 Hyperlipidemia, unspecified: Secondary | ICD-10-CM

## 2023-10-19 DIAGNOSIS — D649 Anemia, unspecified: Secondary | ICD-10-CM | POA: Diagnosis present

## 2023-10-19 DIAGNOSIS — R509 Fever, unspecified: Secondary | ICD-10-CM

## 2023-10-19 DIAGNOSIS — A4150 Gram-negative sepsis, unspecified: Secondary | ICD-10-CM | POA: Diagnosis not present

## 2023-10-19 DIAGNOSIS — Z1152 Encounter for screening for COVID-19: Secondary | ICD-10-CM

## 2023-10-19 DIAGNOSIS — E7800 Pure hypercholesterolemia, unspecified: Secondary | ICD-10-CM | POA: Diagnosis present

## 2023-10-19 DIAGNOSIS — Z8615 Personal history of latent tuberculosis infection: Secondary | ICD-10-CM

## 2023-10-19 DIAGNOSIS — N390 Urinary tract infection, site not specified: Secondary | ICD-10-CM | POA: Diagnosis not present

## 2023-10-19 DIAGNOSIS — I700 Atherosclerosis of aorta: Secondary | ICD-10-CM | POA: Diagnosis present

## 2023-10-19 DIAGNOSIS — E876 Hypokalemia: Secondary | ICD-10-CM | POA: Diagnosis present

## 2023-10-19 DIAGNOSIS — N200 Calculus of kidney: Secondary | ICD-10-CM | POA: Diagnosis present

## 2023-10-19 DIAGNOSIS — Z888 Allergy status to other drugs, medicaments and biological substances status: Secondary | ICD-10-CM

## 2023-10-19 DIAGNOSIS — Z8249 Family history of ischemic heart disease and other diseases of the circulatory system: Secondary | ICD-10-CM

## 2023-10-19 MED ORDER — ACETAMINOPHEN 500 MG PO TABS
1000.0000 mg | ORAL_TABLET | Freq: Once | ORAL | Status: AC
  Administered 2023-10-20: 1000 mg via ORAL
  Filled 2023-10-19: qty 2

## 2023-10-19 MED ORDER — LACTATED RINGERS IV BOLUS (SEPSIS)
1000.0000 mL | Freq: Once | INTRAVENOUS | Status: AC
  Administered 2023-10-20: 1000 mL via INTRAVENOUS

## 2023-10-19 MED ORDER — METRONIDAZOLE 500 MG/100ML IV SOLN
500.0000 mg | Freq: Once | INTRAVENOUS | Status: AC
  Administered 2023-10-20: 500 mg via INTRAVENOUS
  Filled 2023-10-19: qty 100

## 2023-10-19 MED ORDER — SODIUM CHLORIDE 0.9 % IV SOLN
2.0000 g | Freq: Once | INTRAVENOUS | Status: AC
  Administered 2023-10-20: 2 g via INTRAVENOUS
  Filled 2023-10-19: qty 12.5

## 2023-10-19 MED ORDER — VANCOMYCIN HCL IN DEXTROSE 1-5 GM/200ML-% IV SOLN
1000.0000 mg | Freq: Once | INTRAVENOUS | Status: AC
  Administered 2023-10-20: 1000 mg via INTRAVENOUS
  Filled 2023-10-19: qty 200

## 2023-10-19 MED ORDER — LACTATED RINGERS IV SOLN: INTRAVENOUS | Status: AC

## 2023-10-19 NOTE — Progress Notes (Signed)
 CODE SEPSIS - PHARMACY COMMUNICATION  **Broad Spectrum Antibiotics should be administered within 1 hour of Sepsis diagnosis**  Time Code Sepsis Called/Page Received: 2330  Antibiotics Ordered: Cefepime , Flagyl , Vancomycin   Time of 1st antibiotic administration: 0013  Rankin CANDIE Dills, PharmD, [facility] 10/19/2023 11:31 PM
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
## 2023-10-19 NOTE — Sepsis Progress Note (Signed)
 Following for sepsis monitoring ?

## 2023-10-20 ENCOUNTER — Inpatient Hospital Stay

## 2023-10-20 DIAGNOSIS — E876 Hypokalemia: Secondary | ICD-10-CM | POA: Diagnosis present

## 2023-10-20 DIAGNOSIS — N200 Calculus of kidney: Secondary | ICD-10-CM | POA: Diagnosis present

## 2023-10-20 DIAGNOSIS — Z888 Allergy status to other drugs, medicaments and biological substances status: Secondary | ICD-10-CM | POA: Diagnosis not present

## 2023-10-20 DIAGNOSIS — E785 Hyperlipidemia, unspecified: Secondary | ICD-10-CM | POA: Diagnosis not present

## 2023-10-20 DIAGNOSIS — N390 Urinary tract infection, site not specified: Secondary | ICD-10-CM

## 2023-10-20 DIAGNOSIS — Z9049 Acquired absence of other specified parts of digestive tract: Secondary | ICD-10-CM | POA: Diagnosis not present

## 2023-10-20 DIAGNOSIS — Z1152 Encounter for screening for COVID-19: Secondary | ICD-10-CM | POA: Diagnosis not present

## 2023-10-20 DIAGNOSIS — D649 Anemia, unspecified: Secondary | ICD-10-CM | POA: Diagnosis present

## 2023-10-20 DIAGNOSIS — I700 Atherosclerosis of aorta: Secondary | ICD-10-CM | POA: Diagnosis present

## 2023-10-20 DIAGNOSIS — Z79899 Other long term (current) drug therapy: Secondary | ICD-10-CM | POA: Diagnosis not present

## 2023-10-20 DIAGNOSIS — A419 Sepsis, unspecified organism: Secondary | ICD-10-CM | POA: Diagnosis not present

## 2023-10-20 DIAGNOSIS — Z8249 Family history of ischemic heart disease and other diseases of the circulatory system: Secondary | ICD-10-CM | POA: Diagnosis not present

## 2023-10-20 DIAGNOSIS — A4150 Gram-negative sepsis, unspecified: Principal | ICD-10-CM

## 2023-10-20 DIAGNOSIS — E7800 Pure hypercholesterolemia, unspecified: Secondary | ICD-10-CM | POA: Diagnosis present

## 2023-10-20 DIAGNOSIS — Z8615 Personal history of latent tuberculosis infection: Secondary | ICD-10-CM | POA: Diagnosis not present

## 2023-10-20 DIAGNOSIS — Z87442 Personal history of urinary calculi: Secondary | ICD-10-CM | POA: Diagnosis not present

## 2023-10-20 LAB — CBC WITH DIFFERENTIAL/PLATELET
Abs Immature Granulocytes: 0.03 K/uL (ref 0.00–0.07)
Basophils Absolute: 0 K/uL (ref 0.0–0.1)
Basophils Relative: 0 %
Eosinophils Absolute: 0.2 K/uL (ref 0.0–0.5)
Eosinophils Relative: 2 %
HCT: 34.6 % — ABNORMAL LOW (ref 36.0–46.0)
Hemoglobin: 11.6 g/dL — ABNORMAL LOW (ref 12.0–15.0)
Immature Granulocytes: 0 %
Lymphocytes Relative: 11 %
Lymphs Abs: 0.8 K/uL (ref 0.7–4.0)
MCH: 30 pg (ref 26.0–34.0)
MCHC: 33.5 g/dL (ref 30.0–36.0)
MCV: 89.4 fL (ref 80.0–100.0)
Monocytes Absolute: 0.8 K/uL (ref 0.1–1.0)
Monocytes Relative: 11 %
Neutro Abs: 5.8 K/uL (ref 1.7–7.7)
Neutrophils Relative %: 76 %
Platelets: 132 K/uL — ABNORMAL LOW (ref 150–400)
RBC: 3.87 MIL/uL (ref 3.87–5.11)
RDW: 11.8 % (ref 11.5–15.5)
WBC: 7.7 K/uL (ref 4.0–10.5)
nRBC: 0 % (ref 0.0–0.2)

## 2023-10-20 LAB — BASIC METABOLIC PANEL WITH GFR
Anion gap: 6 (ref 5–15)
BUN: 15 mg/dL (ref 8–23)
CO2: 26 mmol/L (ref 22–32)
Calcium: 9.1 mg/dL (ref 8.9–10.3)
Chloride: 109 mmol/L (ref 98–111)
Creatinine, Ser: 0.74 mg/dL (ref 0.44–1.00)
GFR, Estimated: >60 mL/min (ref >60)
Glucose, Bld: 112 mg/dL — ABNORMAL HIGH (ref 70–99)
Potassium: 3.6 mmol/L (ref 3.5–5.1)
Sodium: 141 mmol/L (ref 135–145)

## 2023-10-20 LAB — PROTIME-INR
INR: 1.2 (ref 0.8–1.2)
Prothrombin Time: 16.3 s — ABNORMAL HIGH (ref 11.4–15.2)

## 2023-10-20 LAB — URINALYSIS, W/ REFLEX TO CULTURE (INFECTION SUSPECTED)
Bilirubin Urine: NEGATIVE
Glucose, UA: NEGATIVE mg/dL
Ketones, ur: NEGATIVE mg/dL
Nitrite: POSITIVE — AB
Protein, ur: NEGATIVE mg/dL
Specific Gravity, Urine: 1.013 (ref 1.005–1.030)
pH: 5 (ref 5.0–8.0)

## 2023-10-20 LAB — COMPREHENSIVE METABOLIC PANEL WITH GFR
ALT: 10 U/L (ref 0–44)
AST: 15 U/L (ref 15–41)
Albumin: 3.6 g/dL (ref 3.5–5.0)
Alkaline Phosphatase: 86 U/L (ref 38–126)
Anion gap: 5 (ref 5–15)
BUN: 21 mg/dL (ref 8–23)
CO2: 26 mmol/L (ref 22–32)
Calcium: 9.3 mg/dL (ref 8.9–10.3)
Chloride: 107 mmol/L (ref 98–111)
Creatinine, Ser: 0.89 mg/dL (ref 0.44–1.00)
GFR, Estimated: >60 mL/min (ref >60)
Glucose, Bld: 161 mg/dL — ABNORMAL HIGH (ref 70–99)
Potassium: 3.4 mmol/L — ABNORMAL LOW (ref 3.5–5.1)
Sodium: 138 mmol/L (ref 135–145)
Total Bilirubin: 0.7 mg/dL (ref 0.0–1.2)
Total Protein: 7 g/dL (ref 6.5–8.1)

## 2023-10-20 LAB — CBC
HCT: 32.5 % — ABNORMAL LOW (ref 36.0–46.0)
Hemoglobin: 11 g/dL — ABNORMAL LOW (ref 12.0–15.0)
MCH: 30.5 pg (ref 26.0–34.0)
MCHC: 33.8 g/dL (ref 30.0–36.0)
MCV: 90 fL (ref 80.0–100.0)
Platelets: 108 K/uL — ABNORMAL LOW (ref 150–400)
RBC: 3.61 MIL/uL — ABNORMAL LOW (ref 3.87–5.11)
RDW: 11.7 % (ref 11.5–15.5)
WBC: 6.9 K/uL (ref 4.0–10.5)
nRBC: 0 % (ref 0.0–0.2)

## 2023-10-20 LAB — RESP PANEL BY RT-PCR (RSV, FLU A&B, COVID)  RVPGX2
Influenza A by PCR: NEGATIVE
Influenza B by PCR: NEGATIVE
Resp Syncytial Virus by PCR: NEGATIVE
SARS Coronavirus 2 by RT PCR: NEGATIVE

## 2023-10-20 LAB — HIV ANTIBODY (ROUTINE TESTING W REFLEX): HIV Screen 4th Generation wRfx: NONREACTIVE

## 2023-10-20 LAB — CORTISOL-AM, BLOOD: Cortisol - AM: 3.7 ug/dL — ABNORMAL LOW (ref 6.7–22.6)

## 2023-10-20 LAB — LACTIC ACID, PLASMA: Lactic Acid, Venous: 0.8 mmol/L (ref 0.5–1.9)

## 2023-10-20 MED ORDER — FESOTERODINE FUMARATE ER 4 MG PO TB24: 4.0000 mg | ORAL_TABLET | Freq: Every day | ORAL | Status: DC

## 2023-10-20 MED ORDER — ENOXAPARIN SODIUM 40 MG/0.4ML IJ SOSY
40.0000 mg | PREFILLED_SYRINGE | INTRAMUSCULAR | Status: DC
  Administered 2023-10-20 – 2023-10-21 (×2): 40 mg via SUBCUTANEOUS
  Filled 2023-10-20 (×2): qty 0.4

## 2023-10-20 MED ORDER — LACTATED RINGERS IV BOLUS
1000.0000 mL | Freq: Once | INTRAVENOUS | Status: AC
  Administered 2023-10-20: 1000 mL via INTRAVENOUS

## 2023-10-20 MED ORDER — SODIUM CHLORIDE 0.9 % IV SOLN
2.0000 g | INTRAVENOUS | Status: DC
  Administered 2023-10-20 – 2023-10-21 (×2): 2 g via INTRAVENOUS
  Filled 2023-10-20 (×3): qty 20

## 2023-10-20 MED ORDER — ALBUTEROL SULFATE (2.5 MG/3ML) 0.083% IN NEBU: 2.5000 mg | INHALATION_SOLUTION | Freq: Four times a day (QID) | RESPIRATORY_TRACT | Status: DC | PRN

## 2023-10-20 MED ORDER — MAGNESIUM HYDROXIDE 400 MG/5ML PO SUSP: 30.0000 mL | Freq: Every day | ORAL | Status: DC | PRN

## 2023-10-20 MED ORDER — LACTATED RINGERS IV SOLN: 150.0000 mL/h | INTRAVENOUS | Status: DC

## 2023-10-20 MED ORDER — ACETAMINOPHEN 650 MG RE SUPP: 650.0000 mg | Freq: Four times a day (QID) | RECTAL | Status: DC | PRN

## 2023-10-20 MED ORDER — ATORVASTATIN CALCIUM 20 MG PO TABS: 40.0000 mg | ORAL_TABLET | Freq: Every day | ORAL | Status: DC

## 2023-10-20 MED ORDER — ACETAMINOPHEN 325 MG PO TABS
650.0000 mg | ORAL_TABLET | Freq: Four times a day (QID) | ORAL | Status: DC | PRN
  Administered 2023-10-20 (×2): 650 mg via ORAL
  Filled 2023-10-20 (×2): qty 2

## 2023-10-20 MED ORDER — TRAZODONE HCL 50 MG PO TABS: 25.0000 mg | ORAL_TABLET | Freq: Every evening | ORAL | Status: DC | PRN

## 2023-10-20 MED ORDER — ONDANSETRON HCL 4 MG/2ML IJ SOLN: 4.0000 mg | Freq: Four times a day (QID) | INTRAMUSCULAR | Status: DC | PRN

## 2023-10-20 MED ORDER — ONDANSETRON HCL 4 MG PO TABS
4.0000 mg | ORAL_TABLET | Freq: Four times a day (QID) | ORAL | Status: DC | PRN
Start: 2023-10-20 — End: 2023-10-21

## 2023-10-20 MED ORDER — ALBUTEROL SULFATE HFA 108 (90 BASE) MCG/ACT IN AERS: 2.0000 | INHALATION_SPRAY | Freq: Four times a day (QID) | RESPIRATORY_TRACT | Status: DC | PRN

## 2023-10-20 NOTE — H&P (Signed)
    PATIENT NAME: Christina Sawyer    MR#:  [id number]  DATE OF BIRTH:  1960/10/20  DATE OF ADMISSION:  10/19/2023  PRIMARY CARE PHYSICIAN: Salman, Faiza, MD   Patient is coming from: Home  REQUESTING/REFERRING PHYSICIAN: Ward, Josette SAILOR, DO  CHIEF COMPLAINT:   Chief Complaint  Patient presents with   Weakness   Fever    HISTORY OF PRESENT ILLNESS:  Christina Sawyer is a 63 y.o. Caucasian female with medical history significant for dyslipidemia, urolithiasis, latent TB, and urinary incontinence,  who presented to the emergency room with acute onset of generalized weakness as well as fever and chills.  The patient had a  urethral sling and InterStim.  She denies any dysuria, oliguria or hematuria, urgency or frequency or flank pain.  No cough or wheezing or dyspnea.  No chest pain or palpitations.  No nausea or vomiting or abdominal pain.  ED Course: When she came to the ER, temperature was 101.2, blood pressure 152/79 and heart rate 102 with otherwise normal vital signs.  Labs revealed mild hypokalemia of 3.4 with glucose of 161.  CBC showed mild anemia.  Respiratory panel came back negative.  UA was positive for UTI. EKG as reviewed by me : None Imaging: Portable chest x-ray showed aortic atherosclerosis with no acute cardiopulmonary disease.  The patient was given IV cefepime , vancomycin  and Flagyl , 2 L bolus of IV lactated Ringer  and 1 g of p.o. Tylenol .  She will be admitted to a medical telemetry bed for further evaluation and management. PAST MEDICAL HISTORY:   Past Medical History:  Diagnosis Date   Bladder incontinence    interstim implant- Right flank   Complication of anesthesia    Stadol caused over sedation   History of kidney stones    Hypercholesteremia    Kidney stone    at present-has stent in place.   PONV (postoperative nausea and vomiting)    Tuberculosis    latent tuberculosis-tx. 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    PAST SURGICAL HISTORY:   Past  Surgical History:  Procedure Laterality Date   ANAL SPHINCTEROPLASTY N/A 02/2013   APPENDECTOMY     CHOLECYSTECTOMY     CYSTOSCOPY W/ URETERAL STENT PLACEMENT Left 04/15/2015   Procedure: CYSTOSCOPY WITH RETROGRADE PYELOGRAM/URETERAL STENT PLACEMENT;  Surgeon: Garnette Shack, MD;  Location: [facility];  Service: Urology;  Laterality: Left;   CYSTOSCOPY WITH STENT PLACEMENT Left 12/29/2019   Procedure: CYSTOSCOPY WITH STENT PLACEMENT;  Surgeon: Watt Rush, MD;  Location: [facility];  Service: Urology;  Laterality: Left;   CYSTOSCOPY WITH URETEROSCOPY AND STENT PLACEMENT Left 05/22/2015   Procedure: CYSTOSCOPY WITH URETEROSCOPY, STONE BASKETRY AND STENT EXCHANGE;  Surgeon: Garnette Shack, MD;  Location: [facility];  Service: Urology;  Laterality: Left;   CYSTOSCOPY/URETEROSCOPY/HOLMIUM LASER/STENT PLACEMENT Left 01/07/2020   Procedure: CYSTOSCOPY/URETEROSCOPY/HOLMIUM LASER/STENT PLACEMENT;  Surgeon: Penne Knee, MD;  Location: [facility];  Service: Urology;  Laterality: Left;   HOLMIUM LASER APPLICATION Left 05/22/2015   Procedure: HOLMIUM LASER APPLICATION;  Surgeon: Garnette Shack, MD;  Location: [facility];  Service: Urology;  Laterality: Left;   INTERSTIM IMPLANT PLACEMENT     TUBAL LIGATION      SOCIAL HISTORY:   Social History   Tobacco Use   Smoking status: Never    Passive exposure: Yes   Smokeless tobacco: Never  Substance Use Topics   Alcohol use: Not Currently    Alcohol/week: 1.0 standard drink of alcohol    Types: 1 Glasses of  wine per week    Comment: wine occ    FAMILY HISTORY:   Positive for hypertension and cancer.  DRUG ALLERGIES:   Allergies  Allergen Reactions   Butorphanol Other (See Comments)    goes underover sedation Sensitivity     REVIEW OF SYSTEMS:   ROS As per history of present illness. All pertinent systems were reviewed above. Constitutional, HEENT, cardiovascular, respiratory, GI, GU, musculoskeletal, neuro, psychiatric, endocrine,  integumentary and hematologic systems were reviewed and are otherwise negative/unremarkable except for positive findings mentioned above in the HPI.   MEDICATIONS AT HOME:   Prior to Admission medications   Medication Sig Start Date End Date Taking? Authorizing Provider  albuterol  (VENTOLIN  HFA) 108 (90 Base) MCG/ACT inhaler Inhale 2 puffs into the lungs every 6 (six) hours as needed for wheezing or shortness of breath. 10/31/21   Willo Dunnings, MD  atorvastatin  (LIPITOR) 40 MG tablet Take 40 mg by mouth at bedtime.    [provider]  Benzocaine-Resorcinol (VAGISIL EX) Place 1 application vaginally daily as needed (dryness).     [provider]  ibuprofen  (ADVIL ) 200 MG tablet Take 400 mg by mouth every 6 (six) hours as needed for moderate pain.    [provider]  solifenacin  (VESICARE ) 10 MG tablet Take 1 tablet (10 mg total) by mouth every evening. Take 2 hours prior to bedtime 01/07/20   Penne Knee, MD      VITAL SIGNS:  Blood pressure 121/72, pulse 61, temperature 97.7 F (36.5 C), temperature source Oral, resp. rate 11, height 5' 6 (1.676 m), weight 61.2 kg, SpO2 98%.  PHYSICAL EXAMINATION:  Physical Exam  GENERAL:  63 y.o.-year-old Caucasian female patient lying in the bed with no acute distress.  EYES: Pupils equal, round, reactive to light and accommodation. No scleral icterus. Extraocular muscles intact.  HEENT: Head atraumatic, normocephalic. Oropharynx and nasopharynx clear.  NECK:  Supple, no jugular venous distention. No thyroid enlargement, no tenderness.  LUNGS: Normal breath sounds bilaterally, no wheezing, rales,rhonchi or crepitation. No use of accessory muscles of respiration.  CARDIOVASCULAR: Regular rate and rhythm, S1, S2 normal. No murmurs, rubs, or gallops.  ABDOMEN: Soft, nondistended, nontender. Bowel sounds present. No organomegaly or mass.  EXTREMITIES: No pedal edema, cyanosis, or clubbing.  NEUROLOGIC: Cranial nerves II  through XII are intact. Muscle strength 5/5 in all extremities. Sensation intact. Gait not checked.  PSYCHIATRIC: The patient is alert and oriented x 3.  Normal affect and good eye contact. SKIN: No obvious rash, lesion, or ulcer.   LABORATORY PANEL:   CBC Recent Labs  Lab 10/20/23 0509  WBC 6.9  HGB 11.0*  HCT 32.5*  PLT 108*   ------------------------------------------------------------------------------------------------------------------  Chemistries  Recent Labs  Lab 10/19/23 2358 10/20/23 0509  NA 138 141  K 3.4* 3.6  CL 107 109  CO2 26 26  GLUCOSE 161* 112*  BUN 21 15  CREATININE 0.89 0.74  CALCIUM  9.3 9.1  AST 15  --   ALT 10  --   ALKPHOS 86  --   BILITOT 0.7  --    ------------------------------------------------------------------------------------------------------------------  Cardiac Enzymes No results for input(s): TROPONINI in the last 168 hours. ------------------------------------------------------------------------------------------------------------------  RADIOLOGY:  DG Chest Port 1 View Result Date: 10/19/2023 CLINICAL DATA:  Questionable sepsis - evaluate for abnormality EXAM: PORTABLE CHEST 1 VIEW COMPARISON:  Chest x-ray 10/29/2021 FINDINGS: The heart and mediastinal contours are unchanged. Atherosclerotic plaque. No focal consolidation. No pulmonary edema. No pleural effusion. No pneumothorax. No acute osseous abnormality.  IMPRESSION: 1. No active disease. 2.  Aortic Atherosclerosis (ICD10-I70.0). Electronically Signed   By: Morgane  Naveau M.D.   On: 10/19/2023 23:47      IMPRESSION AND PLAN:  Assessment and Plan: * Sepsis due to gram-negative UTI [facility]) - The patient will be admitted to a medical telemetry bed. - Sepsis manifested by tachycardia and fever. - Will continue antibiotic therapy with IV Rocephin . - Will continue hydration with IV lactated ringer . - The patient is status post ureteral sling and InterStim. - Urology consult  will be obtained. - I notified Dr. Francisca about the patient.  Hypokalemia - Potassium will be replaced.  Dyslipidemia - Will continue statin therapy.   DVT prophylaxis: Lovenox .  Advanced Care Planning:  Code Status: full code.  Family Communication:  The plan of care was discussed in details with the patient (and family). I answered all questions. The patient agreed to proceed with the above mentioned plan. Further management will depend upon hospital course. Disposition Plan: Back to previous home environment Consults called: none.  All the records are reviewed and case discussed with ED provider.  Status is: Inpatient  At the time of the admission, it appears that the appropriate admission status for this patient is inpatient.  This is judged to be reasonable and necessary in order to provide the required intensity of service to ensure the patient's safety given the presenting symptoms, physical exam findings and initial radiographic and laboratory data in the context of comorbid conditions.  The patient requires inpatient status due to high intensity of service, high risk of further deterioration and high frequency of surveillance required.  I certify that at the time of admission, it is my clinical judgment that the patient will require inpatient hospital care extending more than 2 midnights.                            Dispo: The patient is from: Home              Anticipated d/c is to: Home              Patient currently is not medically stable to d/c.              Difficult to place patient: No  Madison DELENA Peaches M.D on 10/20/2023 at 6:50 AM  [facility]   From 7 PM-7 AM, contact night-coverage [email or web address]  CC: Primary care physician; Salman, Faiza, MD

## 2023-10-20 NOTE — Plan of Care (Signed)

## 2023-10-20 NOTE — Plan of Care (Signed)
  Problem: Fluid Volume: Goal: Hemodynamic stability will improve Outcome: Progressing   Problem: Clinical Measurements: Goal: Diagnostic test results will improve Outcome: Progressing Goal: Signs and symptoms of infection will decrease Outcome: Progressing   Problem: Respiratory: Goal: Ability to maintain adequate ventilation will improve Outcome: Progressing   Problem: Health Behavior/Discharge Planning: Goal: Ability to manage health-related needs will improve Outcome: Progressing   Problem: Clinical Measurements: Goal: Ability to maintain clinical measurements within normal limits will improve Outcome: Progressing Goal: Will remain free from infection Outcome: Progressing Goal: Diagnostic test results will improve Outcome: Progressing Goal: Respiratory complications will improve Outcome: Progressing Goal: Cardiovascular complication will be avoided Outcome: Progressing   Problem: Activity: Goal: Risk for activity intolerance will decrease Outcome: Progressing   Problem: Elimination: Goal: Will not experience complications related to bowel motility Outcome: Progressing Goal: Will not experience complications related to urinary retention Outcome: Progressing   Problem: Pain Managment: Goal: General experience of comfort will improve and/or be controlled Outcome: Progressing   Problem: Skin Integrity: Goal: Risk for impaired skin integrity will decrease Outcome: Progressing

## 2023-10-20 NOTE — Assessment & Plan Note (Addendum)
 Improved to 3.6 after repletion

## 2023-10-20 NOTE — Progress Notes (Signed)
  Progress Note   Patient: Christina Sawyer FMW:[id number] DOB: 1961-01-26 DOA: 10/19/2023     0 DOS: the patient was seen and examined on 10/20/2023   Brief hospital course: Taken from H&P.  Christina Sawyer is a 63 y.o. Caucasian female with medical history significant for dyslipidemia, urolithiasis, latent TB, and urinary incontinence,  who presented to the emergency room with acute onset of generalized weakness as well as fever and chills.  The patient had a  urethral sling and InterStim.   On presentation patient was febrile at 101.2, mild tachycardia at 102, labs with mild hypokalemia 3.4, mild leukocytosis.  Respiratory panel negative.  UA concerning for UTI.  Patient received IV fluid and broad-spectrum antibiotics per sepsis protocol and continued on Rocephin .  Urology was consulted.  8/7: Afebrile, stable labs, preliminary blood cultures negative in 12 hours, urine cultures were ordered as add-on. CT renal stone study with nonobstructing bilateral renal calculi and no hydronephrosis.  There was also enlarged right inguinal lymph node likely reactive.  Assessment and Plan: * Sepsis due to gram-negative UTI (HCC) - Sepsis manifested by tachycardia and fever. Preliminary blood cultures negative, urine cultures ordered as add-on.  Renal stone study with bilateral nonobstructing nephrolithiasis, no hydronephrosis - Will continue antibiotic therapy with IV Rocephin . -Urology is on board -Continue with supportive care  Hypokalemia Improved to 3.6 after repletion  Dyslipidemia - Will continue statin therapy.   Subjective: Patient was seen and examined today.  Denies any pain.  Physical Exam: Vitals:   10/20/23 0419 10/20/23 0442 10/20/23 0759 10/20/23 1338  BP: 116/66 121/72 139/85 137/74  Pulse: 64 61 66 64  Resp: 11  18 18   Temp:  97.7 F (36.5 C) 98.1 F (36.7 C) 98.6 F (37 C)  TempSrc:  Oral Oral Oral  SpO2: 96% 98% 98% 97%  Weight:      Height:       General.  Thin built lady,  in no acute distress. Pulmonary.  Lungs clear bilaterally, normal respiratory effort. CV.  Regular rate and rhythm, no JVD, rub or murmur. Abdomen.  Soft, nontender, nondistended, BS positive. CNS.  Alert and oriented .  No focal neurologic deficit. Extremities.  No edema, no cyanosis, pulses intact and symmetrical. Psychiatry.  Judgment and insight appears normal.   Data Reviewed: Prior data reviewed  Family Communication: Discussed with patient  Disposition: Status is: Inpatient Remains inpatient appropriate because: Severity of illness  Planned Discharge Destination: Home  DVT prophylaxis.  Lovenox  Time spent:  minutes  This record has been created using Conservation officer, historic buildings. Errors have been sought and corrected,but may not always be located. Such creation errors do not reflect on the standard of care.   Author: Amaryllis Dare, MD 10/20/2023 3:34 PM  For on call review [email or web address].

## 2023-10-20 NOTE — Assessment & Plan Note (Addendum)
-   Sepsis manifested by tachycardia and fever. Preliminary blood cultures negative, urine cultures ordered as add-on.  Renal stone study with bilateral nonobstructing nephrolithiasis, no hydronephrosis - Will continue antibiotic therapy with IV Rocephin . -Urology is on board -Continue with supportive care

## 2023-10-20 NOTE — Assessment & Plan Note (Signed)
 Will continue statin therapy

## 2023-10-20 NOTE — Consult Note (Cosign Needed Addendum)
 Urology Consult  I have been asked to see the patient by Dr. Lawence, for evaluation and management of sepsis due to UTI.  Chief Complaint: Weakness, fever  History of Present Illness: Christina Sawyer is a 63 y.o. year old female with PMH nephrolithiasis, urinary incontinence s/p InterStim and mid urethral sling, and fecal incontinence s/p sphincteroplasty admitted overnight with sepsis due to presumed UTI.  Triage vitals notable for mild tachycardia (102) and fever of 38.4 C.  No imaging this admission.  Admission labs notable for normal lactate, 0.8; normal white count, 7.7; normal creatinine at baseline, 0.89; and UA with nitrates, 6-10 WBCs/hpf, and rare bacteria.  Urine culture pending, on antibiotics as below.  This morning, she is afebrile, VSS.  White count and creatinine remain WNL.  She reports 2 days of fevers and weakness without dysuria, flank pain, or gross hematuria.  She has not taken Pyridium.  She is starting to feel chills again this morning.  She has a history of sepsis in the setting of an obstructing left UPJ stone and underwent ureteroscopy with Dr. Penne in 2021.  Anti-infectives (From admission, onward)    Start     Dose/Rate Route Frequency Ordered Stop   10/20/23 0800  cefTRIAXone  (ROCEPHIN ) 2 g in sodium chloride  0.9 % 100 mL IVPB        2 g 200 mL/hr over 30 Minutes Intravenous Every 24 hours 10/20/23 0422 10/27/23 0759   10/19/23 2330  ceFEPIme  (MAXIPIME ) 2 g in sodium chloride  0.9 % 100 mL IVPB        2 g 200 mL/hr over 30 Minutes Intravenous  Once 10/19/23 2325 10/20/23 0041   10/19/23 2330  metroNIDAZOLE  (FLAGYL ) IVPB 500 mg        500 mg 100 mL/hr over 60 Minutes Intravenous  Once 10/19/23 2325 10/20/23 0205   10/19/23 2330  vancomycin  (VANCOCIN ) IVPB 1000 mg/200 mL premix        1,000 mg 200 mL/hr over 60 Minutes Intravenous  Once 10/19/23 2325 10/20/23 0458        Past Medical History:  Diagnosis Date   Bladder incontinence    interstim  implant- Right flank   Complication of anesthesia    Stadol caused over sedation   History of kidney stones    Hypercholesteremia    Kidney stone    at present-has stent in place.   PONV (postoperative nausea and vomiting)    Tuberculosis    latent tuberculosis-tx. 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    Past Surgical History:  Procedure Laterality Date   ANAL SPHINCTEROPLASTY N/A 02/2013   APPENDECTOMY     CHOLECYSTECTOMY     CYSTOSCOPY W/ URETERAL STENT PLACEMENT Left 04/15/2015   Procedure: CYSTOSCOPY WITH RETROGRADE PYELOGRAM/URETERAL STENT PLACEMENT;  Surgeon: Garnette Shack, MD;  Location: [facility];  Service: Urology;  Laterality: Left;   CYSTOSCOPY WITH STENT PLACEMENT Left 12/29/2019   Procedure: CYSTOSCOPY WITH STENT PLACEMENT;  Surgeon: Watt Rush, MD;  Location: [facility];  Service: Urology;  Laterality: Left;   CYSTOSCOPY WITH URETEROSCOPY AND STENT PLACEMENT Left 05/22/2015   Procedure: CYSTOSCOPY WITH URETEROSCOPY, STONE BASKETRY AND STENT EXCHANGE;  Surgeon: Garnette Shack, MD;  Location: [facility];  Service: Urology;  Laterality: Left;   CYSTOSCOPY/URETEROSCOPY/HOLMIUM LASER/STENT PLACEMENT Left 01/07/2020   Procedure: CYSTOSCOPY/URETEROSCOPY/HOLMIUM LASER/STENT PLACEMENT;  Surgeon: Penne Knee, MD;  Location: [facility];  Service: Urology;  Laterality: Left;   HOLMIUM LASER APPLICATION Left 05/22/2015   Procedure: HOLMIUM LASER APPLICATION;  Surgeon: Garnette Shack, MD;  Location:  [facility];  Service: Urology;  Laterality: Left;   INTERSTIM IMPLANT PLACEMENT     TUBAL LIGATION      Home Medications:  No outpatient medications have been marked as taking for the 10/19/23 encounter [facility] Encounter).    Allergies:  Allergies  Allergen Reactions   Butorphanol Other (See Comments)    goes underover sedation Sensitivity     History reviewed. No pertinent family history.  Social History:  reports that she has never smoked. She has been exposed to tobacco smoke. She  has never used smokeless tobacco. She reports that she does not currently use alcohol after a past usage of about 1.0 standard drink of alcohol per week. She reports that she does not use drugs.  ROS: A complete review of systems was performed.  All systems are negative except for pertinent findings as noted.  Physical Exam:  Vital signs in last 24 hours: Temp:  [97.7 F (36.5 C)-101.2 F (38.4 C)] 98.1 F (36.7 C) (08/07 0759) Pulse Rate:  [61-102] 66 (08/07 0759) Resp:  [11-18] 18 (08/07 0759) BP: (116-152)/(66-85) 139/85 (08/07 0759) SpO2:  [96 %-98 %] 98 % (08/07 0759) Weight:  [61.2 kg] 61.2 kg (08/06 2320) Constitutional:  Alert and oriented, no acute distress HEENT: [place] AT, moist mucus membranes Cardiovascular: No clubbing, cyanosis, or edema Respiratory: Normal respiratory effort Lymph: No cervical or inguinal adenopathy Neurologic: Grossly intact, no focal deficits, moving all 4 extremities Psychiatric: Normal mood and affect  Laboratory Data:  Recent Labs    10/19/23 2358 10/20/23 0509  WBC 7.7 6.9  HGB 11.6* 11.0*  HCT 34.6* 32.5*   Recent Labs    10/19/23 2358 10/20/23 0509  NA 138 141  K 3.4* 3.6  CL 107 109  CO2 26 26  GLUCOSE 161* 112*  BUN 21 15  CREATININE 0.89 0.74  CALCIUM  9.3 9.1   Recent Labs    10/20/23 0509  INR 1.2   Urinalysis    Component Value Date/Time   COLORURINE YELLOW (A) 10/20/2023 0230   APPEARANCEUR CLEAR (A) 10/20/2023 0230   APPEARANCEUR Cloudy (A) 03/03/2020 0944   LABSPEC 1.013 10/20/2023 0230   PHURINE 5.0 10/20/2023 0230   GLUCOSEU NEGATIVE 10/20/2023 0230   HGBUR SMALL (A) 10/20/2023 0230   BILIRUBINUR NEGATIVE 10/20/2023 0230   BILIRUBINUR Negative 03/03/2020 0944   KETONESUR NEGATIVE 10/20/2023 0230   PROTEINUR NEGATIVE 10/20/2023 0230   NITRITE POSITIVE (A) 10/20/2023 0230   LEUKOCYTESUR TRACE (A) 10/20/2023 0230   Results for orders placed or performed during the hospital encounter of 10/19/23  Blood  Culture (routine x 2)     Status: None (Preliminary result)   Collection Time: 10/19/23 11:26 PM   Specimen: Left Antecubital; Blood  Result Value Ref Range Status   Specimen Description LEFT ANTECUBITAL  Final   Special Requests   Final    BOTTLES DRAWN AEROBIC AND ANAEROBIC Blood Culture results may not be optimal due to an inadequate volume of blood received in culture bottles   Culture   Final    NO GROWTH < 12 HOURS Performed at [facility], [address] [place], [place] [address]    Report Status PENDING  Incomplete  Blood Culture (routine x 2)     Status: None (Preliminary result)   Collection Time: 10/19/23 11:31 PM   Specimen: BLOOD LEFT HAND  Result Value Ref Range Status   Specimen Description BLOOD LEFT HAND  Final   Special Requests   Final  BOTTLES DRAWN AEROBIC AND ANAEROBIC Blood Culture results may not be optimal due to an inadequate volume of blood received in culture bottles   Culture   Final    NO GROWTH < 12 HOURS Performed at [facility], [address] [place], [place] [address]    Report Status PENDING  Incomplete  Resp panel by RT-PCR (RSV, Flu A&B, Covid) Anterior Nasal Swab     Status: None   Collection Time: 10/20/23 12:50 AM   Specimen: Anterior Nasal Swab  Result Value Ref Range Status   SARS Coronavirus 2 by RT PCR NEGATIVE NEGATIVE Final    Comment: (NOTE) SARS-CoV-2 target nucleic acids are NOT DETECTED.  The SARS-CoV-2 RNA is generally detectable in upper respiratory specimens during the acute phase of infection. The lowest concentration of SARS-CoV-2 viral copies this assay can detect is 138 copies/mL. A negative result does not preclude SARS-Cov-2 infection and should not be used as the sole basis for treatment or other patient management decisions. A negative result may occur with  improper specimen collection/handling, submission of specimen other than nasopharyngeal swab, presence of viral mutation(s) within  the areas targeted by this assay, and inadequate number of viral copies(<138 copies/mL). A negative result must be combined with clinical observations, patient history, and epidemiological information. The expected result is Negative.  Fact Sheet for Patients:  BloggerCourse.com  Fact Sheet for Healthcare Providers:  SeriousBroker.it  This test is no t yet approved or cleared by the [place]  FDA and  has been authorized for detection and/or diagnosis of SARS-CoV-2 by FDA under an Emergency Use Authorization (EUA). This EUA will remain  in effect (meaning this test can be used) for the duration of the COVID-19 declaration under Section 564(b)(1) of the Act, 21 U.S.C.section 360bbb-3(b)(1), unless the authorization is terminated  or revoked sooner.       Influenza A by PCR NEGATIVE NEGATIVE Final   Influenza B by PCR NEGATIVE NEGATIVE Final    Comment: (NOTE) The Xpert Xpress SARS-CoV-2/FLU/RSV plus assay is intended as an aid in the diagnosis of influenza from Nasopharyngeal swab specimens and should not be used as a sole basis for treatment. Nasal washings and aspirates are unacceptable for Xpert Xpress SARS-CoV-2/FLU/RSV testing.  Fact Sheet for Patients: BloggerCourse.com  Fact Sheet for Healthcare Providers: SeriousBroker.it  This test is not yet approved or cleared by the [place]  FDA and has been authorized for detection and/or diagnosis of SARS-CoV-2 by FDA under an Emergency Use Authorization (EUA). This EUA will remain in effect (meaning this test can be used) for the duration of the COVID-19 declaration under Section 564(b)(1) of the Act, 21 U.S.C. section 360bbb-3(b)(1), unless the authorization is terminated or revoked.     Resp Syncytial Virus by PCR NEGATIVE NEGATIVE Final    Comment: (NOTE) Fact Sheet for  Patients: BloggerCourse.com  Fact Sheet for Healthcare Providers: SeriousBroker.it  This test is not yet approved or cleared by the [place]  FDA and has been authorized for detection and/or diagnosis of SARS-CoV-2 by FDA under an Emergency Use Authorization (EUA). This EUA will remain in effect (meaning this test can be used) for the duration of the COVID-19 declaration under Section 564(b)(1) of the Act, 21 U.S.C. section 360bbb-3(b)(1), unless the authorization is terminated or revoked.  Performed at [facility], [address] [place], [place] [address]     Radiologic Imaging: [facility] Chest [place] 1 View Result Date: 10/19/2023 CLINICAL DATA:  Questionable sepsis - evaluate for abnormality EXAM: PORTABLE  CHEST 1 VIEW COMPARISON:  Chest x-ray 10/29/2021 FINDINGS: The heart and mediastinal contours are unchanged. Atherosclerotic plaque. No focal consolidation. No pulmonary edema. No pleural effusion. No pneumothorax. No acute osseous abnormality. IMPRESSION: 1. No active disease. 2.  Aortic Atherosclerosis (ICD10-I70.0). Electronically Signed   By: Morgane  Naveau M.D.   On: 10/19/2023 23:47   Assessment & Plan:  63 y.o. female with PMH nephrolithiasis, urinary incontinence s/p InterStim and mid urethral sling, and fecal incontinence s/p sphincteroplasty admitted overnight with sepsis due to presumed UTI.  She was tachycardic and febrile on arrival, but her labs have not been terribly impressive.  I think it is reasonable to keep her on antibiotics given nitrites on UA while we await culture results.  With her history of nephrolithiasis and somewhat atypical presentation, I would like to get a CT scan to rule out ureteral stone.  I have ordered this.  Recommendations: - CT stone study today (ordered) - She may have a diet, no plans for urologic procedure today - Continue antibiotics and follow cultures - Supportive care per  primary team  ADDENDUM: CT stone study with no evidence of obstructing stone. Nothing further to offer from our perspective, continue antibiotics and follow cultures. Urologist will sign off at this time.  Thank you for involving me in this patient's care.  Saran Laviolette, PA-C 10/20/2023 9:15 AM

## 2023-10-20 NOTE — Hospital Course (Addendum)
 Taken from H&P.  Christina Sawyer is a 63 y.o. Caucasian female with medical history significant for dyslipidemia, urolithiasis, latent TB, and urinary incontinence,  who presented to the emergency room with acute onset of generalized weakness as well as fever and chills.  The patient had a  urethral sling and InterStim.   On presentation patient was febrile at 101.2, mild tachycardia at 102, labs with mild hypokalemia 3.4, mild leukocytosis.  Respiratory panel negative.  UA concerning for UTI.  Patient received IV fluid and broad-spectrum antibiotics per sepsis protocol and continued on Rocephin .  Urology was consulted.  8/7: Afebrile, stable labs, preliminary blood cultures negative in 12 hours, urine cultures were ordered as add-on. CT renal stone study with nonobstructing bilateral renal calculi and no hydronephrosis.  There was also enlarged right inguinal lymph node likely reactive.  8/8: Patient remained afebrile.  Blood cultures remain negative and urine cultures are still pending.  No prior history of any drug resistance or frequent UTIs.  Patient received ceftriaxone  while in the hospital and is being discharged on ciprofloxacin  for 5 more days.  Patient need to follow-up with her urologist for definitive treatment of her nephrolithiasis.  Patient was also instructed to follow-up with primary care provider and they can make changes to antibiotics if needed after urine culture results.  She will continue her current medications and follow-up with her providers for further assistance.

## 2023-10-21 DIAGNOSIS — E785 Hyperlipidemia, unspecified: Secondary | ICD-10-CM | POA: Diagnosis not present

## 2023-10-21 DIAGNOSIS — A4150 Gram-negative sepsis, unspecified: Secondary | ICD-10-CM | POA: Diagnosis not present

## 2023-10-21 DIAGNOSIS — E876 Hypokalemia: Secondary | ICD-10-CM | POA: Diagnosis not present

## 2023-10-21 DIAGNOSIS — N390 Urinary tract infection, site not specified: Secondary | ICD-10-CM | POA: Diagnosis not present

## 2023-10-21 LAB — URINE CULTURE: Culture: NO GROWTH

## 2023-10-21 MED ORDER — CIPROFLOXACIN HCL 500 MG PO TABS: 500.0000 mg | ORAL_TABLET | Freq: Two times a day (BID) | ORAL | 0 refills | Status: AC

## 2023-10-21 MED ORDER — ACETAMINOPHEN 650 MG RE SUPP: 650.0000 mg | Freq: Four times a day (QID) | RECTAL | Status: DC | PRN

## 2023-10-21 MED ORDER — ACETAMINOPHEN 500 MG PO TABS
1000.0000 mg | ORAL_TABLET | Freq: Four times a day (QID) | ORAL | Status: DC | PRN
  Administered 2023-10-21: 1000 mg via ORAL
  Filled 2023-10-21: qty 2

## 2023-10-21 MED ORDER — LIDOCAINE 5 % EX PTCH
1.0000 | MEDICATED_PATCH | Freq: Once | CUTANEOUS | Status: AC
  Administered 2023-10-21: 1 via TRANSDERMAL
  Filled 2023-10-21: qty 1

## 2023-10-21 NOTE — Assessment & Plan Note (Signed)
-   Sepsis manifested by tachycardia and fever. Preliminary blood cultures negative, urine cultures pending.  Renal stone study with bilateral nonobstructing nephrolithiasis, no hydronephrosis Urology is recommending outpatient follow-up Patient received ceftriaxone  while in the hospital, urine cultures remain pending. - She is being discharged on 5 more days of ciprofloxacin .

## 2023-10-21 NOTE — Discharge Summary (Signed)
 Physician Discharge Summary   Patient: Christina Sawyer MRN: [id number] DOB: [birth year]  Admit date:     10/19/2023  Discharge date: 10/21/23  Discharge Physician: Amaryllis Dare   PCP: Salman, Faiza, MD   Recommendations at discharge:  Please follow-up on final urine culture results and make changes to antibiotics as appropriate. Follow-up with primary care provider Follow-up with urology  Discharge Diagnoses: Principal Problem:   Sepsis due to gram-negative UTI [facility]) Active Problems:   Hypokalemia   Dyslipidemia   Acute UTI   Hospital Course: Taken from H&P.  Christina Sawyer is a 63 y.o. Caucasian female with medical history significant for dyslipidemia, urolithiasis, latent TB, and urinary incontinence,  who presented to the emergency room with acute onset of generalized weakness as well as fever and chills.  The patient had a  urethral sling and InterStim.   On presentation patient was febrile at 101.2, mild tachycardia at 102, labs with mild hypokalemia 3.4, mild leukocytosis.  Respiratory panel negative.  UA concerning for UTI.  Patient received IV fluid and broad-spectrum antibiotics per sepsis protocol and continued on Rocephin .  Urology was consulted.  8/7: Afebrile, stable labs, preliminary blood cultures negative in 12 hours, urine cultures were ordered as add-on. CT renal stone study with nonobstructing bilateral renal calculi and no hydronephrosis.  There was also enlarged right inguinal lymph node likely reactive.  8/8: Patient remained afebrile.  Blood cultures remain negative and urine cultures are still pending.  No prior history of any drug resistance or frequent UTIs.  Patient received ceftriaxone  while in the hospital and is being discharged on ciprofloxacin  for 5 more days.  Patient need to follow-up with her urologist for definitive treatment of her nephrolithiasis.  Patient was also instructed to follow-up with primary care provider and they can make changes to  antibiotics if needed after urine culture results.  She will continue her current medications and follow-up with her providers for further assistance.  Assessment and Plan: * Sepsis due to gram-negative UTI (HCC) - Sepsis manifested by tachycardia and fever. Preliminary blood cultures negative, urine cultures pending.  Renal stone study with bilateral nonobstructing nephrolithiasis, no hydronephrosis Urology is recommending outpatient follow-up Patient received ceftriaxone  while in the hospital, urine cultures remain pending. - She is being discharged on 5 more days of ciprofloxacin .  Hypokalemia Improved to 3.6 after repletion  Dyslipidemia - Will continue statin therapy.   Consultants: Urology Procedures performed: None Disposition: Home Diet recommendation:  Discharge Diet Orders (From admission, onward)     Start     Ordered   10/21/23 0000  Diet - low sodium heart healthy        10/21/23 1058           Regular diet DISCHARGE MEDICATION: Allergies as of 10/21/2023       Reactions   Butorphanol Other (See Comments)   goes underover sedation Sensitivity        Medication List     TAKE these medications    albuterol  108 (90 Base) MCG/ACT inhaler Commonly known as: VENTOLIN  HFA Inhale 2 puffs into the lungs every 6 (six) hours as needed for wheezing or shortness of breath.   atorvastatin  40 MG tablet Commonly known as: LIPITOR Take 40 mg by mouth at bedtime.   ciprofloxacin  500 MG tablet Commonly known as: CIPRO  Take 1 tablet (500 mg total) by mouth 2 (two) times daily for 5 days.   ibuprofen  200 MG tablet Commonly known as: ADVIL  Take 400 mg by mouth every  6 (six) hours as needed for moderate pain.   Praluent 75 MG/ML Soaj Generic drug: Alirocumab Inject 75 mg into the skin every 14 (fourteen) days.   solifenacin  10 MG tablet Commonly known as: VESICARE  Take 1 tablet (10 mg total) by mouth every evening. Take 2 hours prior to bedtime    VAGISIL EX Place 1 application vaginally daily as needed (dryness).        Follow-up Information     Salman, Faiza, MD Follow up.   Specialty: Family Medicine Why: hospital follow up Contact information: [address] [place] [address] [phone]                Discharge Exam: Christina Sawyer   10/19/23 2320  Weight: 61.2 kg   General.  Frail lady, in no acute distress. Pulmonary.  Lungs clear bilaterally, normal respiratory effort. CV.  Regular rate and rhythm, no JVD, rub or murmur. Abdomen.  Soft, nontender, nondistended, BS positive. CNS.  Alert and oriented .  No focal neurologic deficit. Extremities.  No edema, no cyanosis, pulses intact and symmetrical. Psychiatry.  Judgment and insight appears normal.   Condition at discharge: stable  The results of significant diagnostics from this hospitalization (including imaging, microbiology, ancillary and laboratory) are listed below for reference.   Imaging Studies: CT RENAL STONE STUDY Result Date: 10/20/2023 CLINICAL DATA:  Abdominal/flank pain, stone suspected EXAM: CT ABDOMEN AND PELVIS WITHOUT CONTRAST TECHNIQUE: Multidetector CT imaging of the abdomen and pelvis was performed following the standard protocol without IV contrast. RADIATION DOSE REDUCTION: This exam was performed according to the departmental dose-optimization program which includes automated exposure control, adjustment of the mA and/or kV according to patient size and/or use of iterative reconstruction technique. COMPARISON:  CT abdomen/pelvis dated 12/28/2019. FINDINGS: Lower chest: No acute abnormality. Hepatobiliary: No suspicious focal hepatic lesion identified within the limits of an unenhanced exam. Status post cholecystectomy. No biliary dilatation. Pancreas: Unremarkable. No pancreatic ductal dilatation or surrounding inflammatory changes. Spleen: Normal in size without focal abnormality. Adrenals/Urinary Tract: Adrenal glands are  unremarkable. Nonobstructive bilateral renal calculi with the largest measuring up to 3 mm in the right inferior pole. No ureteral calculi. No hydronephrosis. Bladder is unremarkable. Stomach/Bowel: Stomach is within normal limits. Status post appendectomy. Small bowel and colon are grossly unremarkable. No evidence of obstruction or inflammatory changes. Vascular/Lymphatic: Abdominal aorta is normal in caliber. Enlarged right inguinal lymph node measures up to 18 mm in short axis (2:85). No enlarged abdominal lymph nodes. Reproductive: Uterus and bilateral adnexa are unremarkable. Other: No abdominopelvic ascites. No intraperitoneal free air. No abdominal wall hernia. Musculoskeletal: No acute osseous abnormality. No suspicious osseous lesion. Levocurvature of the lumbar spine centered at L3-L4 with degenerative changes. Nerve stimulator battery pack in the right posterior subcutaneous tissues with lead coursing through the right S3 neural foramen. IMPRESSION: 1. Nonobstructive bilateral renal calculi with the largest measuring up to 3 mm in the right inferior pole. No hydronephrosis. 2. Enlarged right inguinal lymph node measuring up to 18 mm in short axis is nonspecific and could be reactive. 3.  Aortic Atherosclerosis (ICD10-I70.0). Electronically Signed   By: Harrietta Sherry M.D.   On: 10/20/2023 15:18   DG Chest Port 1 View Result Date: 10/19/2023 CLINICAL DATA:  Questionable sepsis - evaluate for abnormality EXAM: PORTABLE CHEST 1 VIEW COMPARISON:  Chest x-ray 10/29/2021 FINDINGS: The heart and mediastinal contours are unchanged. Atherosclerotic plaque. No focal consolidation. No pulmonary edema. No pleural effusion. No pneumothorax. No acute osseous abnormality. IMPRESSION: 1.  No active disease. 2.  Aortic Atherosclerosis (ICD10-I70.0). Electronically Signed   By: Morgane  Naveau M.D.   On: 10/19/2023 23:47    Microbiology: Results for orders placed or performed during the hospital encounter of  10/19/23  Blood Culture (routine x 2)     Status: None (Preliminary result)   Collection Time: 10/19/23 11:26 PM   Specimen: Left Antecubital; Blood  Result Value Ref Range Status   Specimen Description LEFT ANTECUBITAL  Final   Special Requests   Final    BOTTLES DRAWN AEROBIC AND ANAEROBIC Blood Culture results may not be optimal due to an inadequate volume of blood received in culture bottles   Culture   Final    NO GROWTH 1 DAY Performed at [facility], [address], [place], [place] [address]    Report Status PENDING  Incomplete  Blood Culture (routine x 2)     Status: None (Preliminary result)   Collection Time: 10/19/23 11:31 PM   Specimen: BLOOD LEFT HAND  Result Value Ref Range Status   Specimen Description BLOOD LEFT HAND  Final   Special Requests   Final    BOTTLES DRAWN AEROBIC AND ANAEROBIC Blood Culture results may not be optimal due to an inadequate volume of blood received in culture bottles   Culture   Final    NO GROWTH 1 DAY Performed at [facility], [address] [place], [place] [address]    Report Status PENDING  Incomplete  Resp panel by RT-PCR (RSV, Flu A&B, Covid) Anterior Nasal Swab     Status: None   Collection Time: 10/20/23 12:50 AM   Specimen: Anterior Nasal Swab  Result Value Ref Range Status   SARS Coronavirus 2 by RT PCR NEGATIVE NEGATIVE Final    Comment: (NOTE) SARS-CoV-2 target nucleic acids are NOT DETECTED.  The SARS-CoV-2 RNA is generally detectable in upper respiratory specimens during the acute phase of infection. The lowest concentration of SARS-CoV-2 viral copies this assay can detect is 138 copies/mL. A negative result does not preclude SARS-Cov-2 infection and should not be used as the sole basis for treatment or other patient management decisions. A negative result may occur with  improper specimen collection/handling, submission of specimen other than nasopharyngeal swab, presence of viral mutation(s)  within the areas targeted by this assay, and inadequate number of viral copies(<138 copies/mL). A negative result must be combined with clinical observations, patient history, and epidemiological information. The expected result is Negative.  Fact Sheet for Patients:  BloggerCourse.com  Fact Sheet for Healthcare Providers:  SeriousBroker.it  This test is no t yet approved or cleared by the [place]  FDA and  has been authorized for detection and/or diagnosis of SARS-CoV-2 by FDA under an Emergency Use Authorization (EUA). This EUA will remain  in effect (meaning this test can be used) for the duration of the COVID-19 declaration under Section 564(b)(1) of the Act, 21 U.S.C.section 360bbb-3(b)(1), unless the authorization is terminated  or revoked sooner.       Influenza A by PCR NEGATIVE NEGATIVE Final   Influenza B by PCR NEGATIVE NEGATIVE Final    Comment: (NOTE) The Xpert Xpress SARS-CoV-2/FLU/RSV plus assay is intended as an aid in the diagnosis of influenza from Nasopharyngeal swab specimens and should not be used as a sole basis for treatment. Nasal washings and aspirates are unacceptable for Xpert Xpress SARS-CoV-2/FLU/RSV testing.  Fact Sheet for Patients: BloggerCourse.com  Fact Sheet for Healthcare Providers: SeriousBroker.it  This test is not yet approved  or cleared by the [place]  FDA and has been authorized for detection and/or diagnosis of SARS-CoV-2 by FDA under an Emergency Use Authorization (EUA). This EUA will remain in effect (meaning this test can be used) for the duration of the COVID-19 declaration under Section 564(b)(1) of the Act, 21 U.S.C. section 360bbb-3(b)(1), unless the authorization is terminated or revoked.     Resp Syncytial Virus by PCR NEGATIVE NEGATIVE Final    Comment: (NOTE) Fact Sheet for  Patients: BloggerCourse.com  Fact Sheet for Healthcare Providers: SeriousBroker.it  This test is not yet approved or cleared by the [place]  FDA and has been authorized for detection and/or diagnosis of SARS-CoV-2 by FDA under an Emergency Use Authorization (EUA). This EUA will remain in effect (meaning this test can be used) for the duration of the COVID-19 declaration under Section 564(b)(1) of the Act, 21 U.S.C. section 360bbb-3(b)(1), unless the authorization is terminated or revoked.  Performed at [facility], [address] [place], [place] [address]     Labs: CBC: Recent Labs  Lab 10/19/23 2358 10/20/23 0509  WBC 7.7 6.9  NEUTROABS 5.8  --   HGB 11.6* 11.0*  HCT 34.6* 32.5*  MCV 89.4 90.0  PLT 132* 108*   Basic Metabolic Panel: Recent Labs  Lab 10/19/23 2358 10/20/23 0509  NA 138 141  K 3.4* 3.6  CL 107 109  CO2 26 26  GLUCOSE 161* 112*  BUN 21 15  CREATININE 0.89 0.74  CALCIUM  9.3 9.1   Liver Function Tests: Recent Labs  Lab 10/19/23 2358  AST 15  ALT 10  ALKPHOS 86  BILITOT 0.7  PROT 7.0  ALBUMIN 3.6   CBG: No results for input(s): GLUCAP in the last 168 hours.  Discharge time spent: greater than 30 minutes.  This record has been created using Conservation officer, historic buildings. Errors have been sought and corrected,but may not always be located. Such creation errors do not reflect on the standard of care.   Signed: Amaryllis Dare, MD [facility] 10/21/2023

## 2023-10-21 NOTE — Progress Notes (Signed)
 Mobility Specialist - Progress Note     10/21/23 0800  Mobility  Activity Ambulated with assistance;Stood at bedside;Dangled on edge of bed  Level of Assistance Standby assist, set-up cues, supervision of patient - no hands on  Assistive Device Front wheel walker  Distance Ambulated (ft) 16 ft  Range of Motion/Exercises Active  Activity Response Tolerated well  Mobility Referral Yes  Mobility visit 1 Mobility  Mobility Specialist Start Time (ACUTE ONLY) 0756  Mobility Specialist Stop Time (ACUTE ONLY) 0813  Mobility Specialist Time Calculation (min) (ACUTE ONLY) 17 min   Pt resting in bed on RA upon entry. Pt STS and ambulates to bathroom SBA with RW. Pt returned to recliner and left with needs in reach. Chair alarm activated and RN present at bedside.   Guido Rumble Mobility Specialist 10/21/23, 8:21 AM

## 2023-10-21 NOTE — Plan of Care (Signed)

## 2023-10-21 NOTE — Assessment & Plan Note (Signed)
 Improved to 3.6 after repletion

## 2023-10-26 LAB — CULTURE, BLOOD (ROUTINE X 2)
Culture: NO GROWTH
Culture: NO GROWTH

## 2024-02-21 NOTE — Progress Notes (Unsigned)
 Pt attended 01/05/2024 screening event with BP of 180/103. Pt noted at event that she does have a PCP. At event pt did not indicate any SDOH needs. Pt also noted that she is not a smoker and listed [employer] as her insurance at the event.   **Per initial f/u pt was reached out via phone and was left vm. Pt was also mailed abnormal results letter per initial f/u.   **Per chart review pt does have a PCP Willa Robert; [facility]), insurance, and is not a smoker. Pt has seen PCP 3 times since event, blood sugar and A1C addressed. Pt does not indicate any SDOH needs at this time.  No additional pt f/u to be scheduled at this time per health equity protocol.

## 2024-04-06 NOTE — Progress Notes (Signed)
 During initial follow up chw wrote-Pt attended 01/05/2024 screening event with BP of 180/103. Pt noted at event that she does have a PCP. At event pt did not indicate any SDOH needs. Pt also noted that she is not a smoker and listed [employer] as her insurance at the event.   Per initial f/u pt was reached out via phone and pt stated she would like to be sent food & utilities resources. Pt requested to be sent resources via email and provided verbal consent to source these resources via FindHelp.  Per chart review pt does have a PCP Willa Robert; [facility]), insurance, and is not a smoker. Pt's last appt with PCP was 10/21/2023. Pt does indicate food & utility SDOH needs at this time.  Additional pt f/u to be scheduled at this time per health equity protocol  VB.  During 60 day f/u- Called pt to see if she still has a food and utility insecurity or if she wanted more resources emailed? CHW Called pt and the call went straight to voicemail with the mail box being full. CHW  resent letter and resources and curtesy bp resources were sent via email and MyChart. A last follow up attempt will be done at a later date.
